# Patient Record
Sex: Male | Born: 1945 | Race: White | Hispanic: No | Marital: Married | Smoking: Former smoker
Health system: Southern US, Community
[De-identification: ages and names within clinical notes are randomized; demographics above are authoritative.]

## PROBLEM LIST (undated history)

## (undated) DIAGNOSIS — IMO0001 Reserved for inherently not codable concepts without codable children: Secondary | ICD-10-CM

## (undated) DIAGNOSIS — E7800 Pure hypercholesterolemia, unspecified: Secondary | ICD-10-CM

## (undated) DIAGNOSIS — I6522 Occlusion and stenosis of left carotid artery: Secondary | ICD-10-CM

## (undated) DIAGNOSIS — I480 Paroxysmal atrial fibrillation: Secondary | ICD-10-CM

## (undated) DIAGNOSIS — Z8679 Personal history of other diseases of the circulatory system: Secondary | ICD-10-CM

## (undated) DIAGNOSIS — R931 Abnormal findings on diagnostic imaging of heart and coronary circulation: Secondary | ICD-10-CM

## (undated) DIAGNOSIS — I10 Essential (primary) hypertension: Secondary | ICD-10-CM

## (undated) HISTORY — DX: Reserved for inherently not codable concepts without codable children: IMO0001

## (undated) HISTORY — DX: Paroxysmal atrial fibrillation: I48.0

## (undated) HISTORY — DX: Pure hypercholesterolemia, unspecified: E78.00

## (undated) HISTORY — DX: Occlusion and stenosis of left carotid artery: I65.22

## (undated) HISTORY — PX: INGUINAL HERNIA REPAIR: SHX194

## (undated) HISTORY — DX: Personal history of other diseases of the circulatory system: Z86.79

## (undated) HISTORY — DX: Essential (primary) hypertension: I10

## (undated) HISTORY — DX: Abnormal findings on diagnostic imaging of heart and coronary circulation: R93.1

## (undated) HISTORY — PX: CHOLECYSTECTOMY: SHX55

---

## 2000-02-21 ENCOUNTER — Encounter: Payer: Self-pay | Admitting: General Surgery

## 2000-02-26 ENCOUNTER — Ambulatory Visit (HOSPITAL_COMMUNITY): Admission: RE | Admit: 2000-02-26 | Discharge: 2000-02-26 | Payer: Self-pay | Admitting: General Surgery

## 2000-02-26 ENCOUNTER — Encounter (INDEPENDENT_AMBULATORY_CARE_PROVIDER_SITE_OTHER): Payer: Self-pay | Admitting: Specialist

## 2002-01-25 ENCOUNTER — Ambulatory Visit (HOSPITAL_COMMUNITY): Admission: RE | Admit: 2002-01-25 | Discharge: 2002-01-25 | Payer: Self-pay | Admitting: Gastroenterology

## 2008-03-16 DIAGNOSIS — IMO0001 Reserved for inherently not codable concepts without codable children: Secondary | ICD-10-CM

## 2008-03-16 HISTORY — DX: Reserved for inherently not codable concepts without codable children: IMO0001

## 2010-03-15 ENCOUNTER — Ambulatory Visit: Payer: Self-pay | Admitting: Cardiology

## 2010-04-11 ENCOUNTER — Ambulatory Visit: Payer: Self-pay | Admitting: Surgery

## 2010-10-29 NOTE — Procedures (Signed)
CAROTID DUPLEX EXAM   INDICATION:  Left-sided carotid stenosis, carotid artery disease.   HISTORY:  Diabetes:  No.  Cardiac:  Atrial fibrillation.  Hypertension:  Yes.  Smoking:  No.  Previous Surgery:  No.  CV History:  Asymptomatic.  Amaurosis Fugax , Paresthesias , Hemiparesis                                       RIGHT             LEFT  Brachial systolic pressure:         126               128  Brachial Doppler waveforms:         WNL               WNL  Vertebral direction of flow:        Antegrade         Antegrade  DUPLEX VELOCITIES (cm/sec)  CCA peak systolic                   91                96  ECA peak systolic                   110               114  ICA peak systolic                   81                134  ICA end diastolic                   26                42  PLAQUE MORPHOLOGY:                  Heterogenous      Heterogenous  PLAQUE AMOUNT:                      Mild-to-moderate  Mild-to-moderate  PLAQUE LOCATION:                    Proximal ICA, bifurcation, CCA      Proximal ICA   IMPRESSION:  1. Left-sided internal carotid artery stenosis of 40% to 59%.  2. No hemodynamically significant stenosis on the right.  3. Soft plaque is seen within the right common carotid artery and the      bulb.   ___________________________________________  V. Charlena Cross, MD   OD/MEDQ  D:  04/11/2010  T:  04/11/2010  Job:  [id number]

## 2010-11-01 NOTE — Op Note (Signed)
[facility]  Patient:    Henry Barnett, Henry Barnett                       MRN: [id number] Proc. Date: 02/26/00 Adm. Date:  [id number] Attending:  Glenna Fellows Tappan                           Operative Report  PREOPERATIVE DIAGNOSES: 1. Left inguinal hernia. 2. Undescended hypoplastic left testicle.  POSTOPERATIVE DIAGNOSES: 1. Left inguinal hernia. 2. Undescended hypoplastic left testicle.  PROCEDURES: 1. Repair of left inguinal hernia. 2. Left orchiectomy.  SURGEON:  Dr. Johna Sheriff.  ANESTHESIA:  General.  BRIEF HISTORY:  Mr. [name] is a 65 year old white male who presents with an enlarging and markedly symptomatic left inguinal hernia confirmed by exam. He also has a history of undescended left testicle and has a hyperplastic left testicle palpable in the left inguinal canal. Repair of his left inguinal hernia and left orchiectomy had been recommended and accepted. The nature of the procedure, its indications and risks of bleeding, infection, and recurrence were discussed and understood preoperatively. He is now brought to the operating room for this procedure.  DESCRIPTION OF PROCEDURE:  The patient was brought to the operating room, placed in supine position on the operating table and general endotracheal anesthesia was induced. Broad spectrum antibiotics were given preoperatively. The left groin was sterilely prepped and draped. An oblique incision was made in the left groin and dissection carried down through the subcutaneous tissue. The external oblique was identified and divided along the lines of its fibers through the external ring. There was a large indirect hernia sac that was bluntly dissected from surrounding tissue and dissected up out of the left scrotum. The small testicle was contained within the wall of the large hernia sac. The hernia sac was defined back to the muscular edges of the inguinal canal. The sac was opened and  was excised circumferentially around the large indirect defect. It was carefully stripped away from the spermatic vessels and vas deferens in their retroperitoneal position. At this point, the spermatic cord was divided in several bites between clamps and divided and the testicle and sac removed. The cord was tied with 3-0 Vicryl ties. The hernia sac was then closed at the markedly enlarged internal ring with running 3-0 Vicryl. Following this, a piece of Prolene mesh was trimmed to size to cover the direct and indirect spaces and was sutured in place with running 2-0 Prolene beginning at the pubic tubercle and working medial to lateral along the inguinal ligament. Medially the mesh was sutured to the edge of the rectosheath with interrupted 2-0 Prolene. This provided wide coverage under no tension. The wound was inspected for hemostasis and was complete. The soft tissue was infiltrated 0.25% Marcaine with epinephrine. The external oblique was closed over the mesh with running 3-0 Vicryl. Scarpas fascia was closed with running 3-0 Vicryl and the skin with running subcuticular 4-0 monocryl and Steri-Strips. Sponge, needle and instrument counts were correct. A dry sterile dressing was applied and the patient taken to the recovery room in good condition. DD:  02/26/00 TD:  02/27/00 Job: [id number]

## 2010-11-01 NOTE — Op Note (Signed)
   Henry Barnett, Henry Barnett                         ACCOUNT NO.:  [id number]   MEDICAL RECORD NO.:  [id number]                   PATIENT TYPE:  AMB   LOCATION:  ENDO                                 FACILITY:  [facility]   PHYSICIAN:  Barrie Folk, M.D.                  DATE OF BIRTH:  06/30/1945   DATE OF PROCEDURE:  DATE OF DISCHARGE:                                 OPERATIVE REPORT   PROCEDURE:  Colonoscopy.   INDICATIONS FOR PROCEDURE:  Nonspecific change in bowel habits in a 65-year-  old patient with no prior colon screening.   DESCRIPTION OF PROCEDURE:  The patient was placed in the left lateral  decubitus position then placed on the pulse monitor with continuous low flow  oxygen delivered by nasal cannula. He was sedated with 90 mg IV Demerol and  9 mg IV Versed. The Olympus video colonoscope was inserted into the rectum  and advanced to the cecum, confirmed by transillumination at McBurney's  point and visualization of the ileocecal valve and appendiceal orifice. The  prep was excellent. The cecum, ascending, transverse, descending and sigmoid  colon all appeared normal with no masses, polyps, diverticula or other  mucosal abnormalities. The rectum likewise appeared normal and retroflexed  view of the anus revealed no obvious internal hemorrhoids. The colonoscope  was then withdrawn and the patient returned to the recovery room in stable  condition. The patient tolerated the procedure well and there were no  immediate complications.   IMPRESSION:  Internal hemorrhoids, otherwise, normal colonoscopy.   PLAN:  Fiber supplement for nonspecific change in bowel habits.                                               Barrie Folk, M.D.    J[facility]/MEDQ  D:  01/25/2002  T:  01/26/2002  Job:  [id number]   cc:   Kristian Covey, M.D.

## 2010-11-12 ENCOUNTER — Other Ambulatory Visit: Payer: Self-pay | Admitting: Cardiology

## 2010-11-13 ENCOUNTER — Other Ambulatory Visit: Payer: Self-pay | Admitting: *Deleted

## 2010-11-13 MED ORDER — TELMISARTAN-HCTZ 40-12.5 MG PO TABS: 1.0000 | ORAL_TABLET | Freq: Every day | ORAL | Status: DC

## 2010-11-13 NOTE — Telephone Encounter (Signed)
Refill done.  

## 2011-01-31 ENCOUNTER — Encounter: Payer: Self-pay | Admitting: Cardiology

## 2011-02-06 ENCOUNTER — Other Ambulatory Visit: Payer: Self-pay | Admitting: Cardiology

## 2011-02-06 DIAGNOSIS — I10 Essential (primary) hypertension: Secondary | ICD-10-CM

## 2011-02-06 DIAGNOSIS — Z8679 Personal history of other diseases of the circulatory system: Secondary | ICD-10-CM

## 2011-02-06 DIAGNOSIS — E7800 Pure hypercholesterolemia, unspecified: Secondary | ICD-10-CM

## 2011-02-07 ENCOUNTER — Other Ambulatory Visit (INDEPENDENT_AMBULATORY_CARE_PROVIDER_SITE_OTHER): Payer: Medicare Other | Admitting: *Deleted

## 2011-02-07 DIAGNOSIS — Z8679 Personal history of other diseases of the circulatory system: Secondary | ICD-10-CM

## 2011-02-07 DIAGNOSIS — E7800 Pure hypercholesterolemia, unspecified: Secondary | ICD-10-CM

## 2011-02-07 DIAGNOSIS — I10 Essential (primary) hypertension: Secondary | ICD-10-CM

## 2011-02-07 LAB — HEPATIC FUNCTION PANEL
ALT: 27 U/L (ref 0–53)
Albumin: 4.3 g/dL (ref 3.5–5.2)
Total Bilirubin: 0.7 mg/dL (ref 0.3–1.2)

## 2011-02-07 LAB — BASIC METABOLIC PANEL
BUN: 18 mg/dL (ref 6–23)
Chloride: 102 mEq/L (ref 96–112)
Creatinine, Ser: 0.9 mg/dL (ref 0.4–1.5)
Glucose, Bld: 113 mg/dL — ABNORMAL HIGH (ref 70–99)

## 2011-02-07 LAB — LIPID PANEL
Cholesterol: 148 mg/dL (ref 0–200)
LDL Cholesterol: 92 mg/dL (ref 0–99)
Triglycerides: 72 mg/dL (ref 0.0–149.0)

## 2011-02-11 ENCOUNTER — Telehealth: Payer: Self-pay | Admitting: *Deleted

## 2011-02-11 NOTE — Telephone Encounter (Signed)
Message copied by Lorayne Bender on Tue Feb 11, 2011  4:56 PM ------      Message from: [place], PETER M      Created: Fri Feb 07, 2011  4:37 PM       Chemistries are ok. Lipids look fairly good.

## 2011-02-11 NOTE — Telephone Encounter (Signed)
Lm w/lab results. Has an app 8/31

## 2011-02-14 ENCOUNTER — Ambulatory Visit (INDEPENDENT_AMBULATORY_CARE_PROVIDER_SITE_OTHER): Payer: Medicare Other | Admitting: Cardiology

## 2011-02-14 ENCOUNTER — Encounter: Payer: Self-pay | Admitting: Cardiology

## 2011-02-14 DIAGNOSIS — E7800 Pure hypercholesterolemia, unspecified: Secondary | ICD-10-CM | POA: Insufficient documentation

## 2011-02-14 DIAGNOSIS — I4891 Unspecified atrial fibrillation: Secondary | ICD-10-CM

## 2011-02-14 DIAGNOSIS — I6522 Occlusion and stenosis of left carotid artery: Secondary | ICD-10-CM

## 2011-02-14 DIAGNOSIS — I480 Paroxysmal atrial fibrillation: Secondary | ICD-10-CM | POA: Insufficient documentation

## 2011-02-14 DIAGNOSIS — I10 Essential (primary) hypertension: Secondary | ICD-10-CM | POA: Insufficient documentation

## 2011-02-14 DIAGNOSIS — I6529 Occlusion and stenosis of unspecified carotid artery: Secondary | ICD-10-CM

## 2011-02-14 NOTE — Progress Notes (Signed)
   [name] Date of Birth: Sep 14, 1945   History of Present Illness: Mr. Mckowen is seen today for followup. In general he has done very well since his visit last year. 6-8 weeks ago he got up in the middle of night to go to the bathroom and noticed that he was staggering. His wife did a neurologic assessment didn't fill it he had any TIA symptoms. He went back to bed and his symptoms resolved. He's had no further difficulty. He denies any chest pain, palpitations, or shortness of breath.  Current Outpatient Prescriptions on File Prior to Visit  Medication Sig Dispense Refill  . Acetaminophen (TYLENOL PO) Take by mouth as needed.        Marland Kitchen aspirin 81 MG tablet Take 81 mg by mouth daily.        . fish oil-omega-3 fatty acids 1000 MG capsule Take 2 g by mouth 2 (two) times daily.        . Sildenafil Citrate (VIAGRA PO) Take by mouth as needed.        . simvastatin (ZOCOR) 20 MG tablet Take 20 mg by mouth at bedtime.        Marland Kitchen telmisartan-hydrochlorothiazide (MICARDIS HCT) 40-12.5 MG per tablet Take 1 tablet by mouth daily.  30 tablet  5    Allergies  Allergen Reactions  . Ace Inhibitors Cough    Past Medical History  Diagnosis Date  . Hypertension   . Hypercholesteremia   . Carotid stenosis, left     moderate Left carotid stenosis  . Paroxysmal a-fib     Past Surgical History  Procedure Date  . Inguinal hernia repair     Status post left inguinal hernia repair  . Cholecystectomy     History  Smoking status  . Former Smoker  . Quit date: 01/31/1975  Smokeless tobacco  . Not on file    History  Alcohol Use No    Family History  Problem Relation Age of Onset  . Heart disease Mother   . Stroke Mother     Review of Systems: The review of systems is positive for low testosterone levels.  He was started on replacement but finds that it's too expensive. All other systems were reviewed and are negative.  Physical Exam: BP 130/78  Pulse 66  Ht 6\' 1"  (1.854 m)  Wt  241 lb 9.6 oz (109.589 kg)  BMI 31.88 kg/m2 He is a pleasant white male in no acute distress. He is normocephalic, atraumatic. Pupils equal round and reactive to light and accommodation. Extraocular movements are full. Oropharynx is clear. Neck is supple without JVD, adenopathy, thyromegaly, or bruits. Lungs are clear. Cardiac exam reveals a regular rate and rhythm without gallop, murmur, or click. He has good pedal pulses. There is no edema. Skin is warm and dry. Neurologic exam is nonfocal. LABORATORY DATA: Complete chemistry panel was normal. Total cholesterol 148, triglycerides 72, HDL 41, LDL 92.  Assessment / Plan:

## 2011-02-14 NOTE — Assessment & Plan Note (Signed)
No evidence of recurrence by history or exam. We will continue surveillance.

## 2011-02-14 NOTE — Patient Instructions (Signed)
Continue your current medications.  I will see you again in 1 year with fasting lab.   

## 2011-02-14 NOTE — Assessment & Plan Note (Signed)
40-60% stenosis in the left carotid by Doppler in October 2011. He is asymptomatic. Continue risk factor modification.

## 2011-02-14 NOTE — Assessment & Plan Note (Signed)
Recent lipid panel was acceptable.

## 2011-03-11 ENCOUNTER — Other Ambulatory Visit: Payer: Self-pay | Admitting: Cardiology

## 2011-03-12 ENCOUNTER — Other Ambulatory Visit: Payer: Self-pay | Admitting: *Deleted

## 2011-03-12 MED ORDER — SIMVASTATIN 20 MG PO TABS: 20.0000 mg | ORAL_TABLET | Freq: Every day | ORAL | Status: DC

## 2011-03-12 NOTE — Telephone Encounter (Signed)
Fax Received. Refill Completed. Jezabel Lecker Chowoe (M.A)  

## 2011-06-08 ENCOUNTER — Other Ambulatory Visit: Payer: Self-pay | Admitting: Cardiology

## 2011-07-11 ENCOUNTER — Ambulatory Visit (INDEPENDENT_AMBULATORY_CARE_PROVIDER_SITE_OTHER): Payer: Medicare Other | Admitting: Nurse Practitioner

## 2011-07-11 ENCOUNTER — Encounter: Payer: Self-pay | Admitting: Nurse Practitioner

## 2011-07-11 VITALS — BP 142/100 | HR 97 | Ht 73.0 in | Wt 253.0 lb

## 2011-07-11 DIAGNOSIS — I4891 Unspecified atrial fibrillation: Secondary | ICD-10-CM

## 2011-07-11 DIAGNOSIS — I10 Essential (primary) hypertension: Secondary | ICD-10-CM

## 2011-07-11 DIAGNOSIS — I480 Paroxysmal atrial fibrillation: Secondary | ICD-10-CM

## 2011-07-11 LAB — HEPATIC FUNCTION PANEL
ALT: 21 U/L (ref 0–53)
AST: 19 U/L (ref 0–37)
Albumin: 4.4 g/dL (ref 3.5–5.2)
Alkaline Phosphatase: 62 U/L (ref 39–117)
Bilirubin, Direct: 0.1 mg/dL (ref 0.0–0.3)
Total Bilirubin: 0.9 mg/dL (ref 0.3–1.2)
Total Protein: 7.8 g/dL (ref 6.0–8.3)

## 2011-07-11 LAB — BASIC METABOLIC PANEL
BUN: 24 mg/dL — ABNORMAL HIGH (ref 6–23)
CO2: 25 mEq/L (ref 19–32)
Calcium: 9.1 mg/dL (ref 8.4–10.5)
Chloride: 104 mEq/L (ref 96–112)
Creatinine, Ser: 0.9 mg/dL (ref 0.4–1.5)
GFR: 84.84 mL/min (ref >60.00)
Glucose, Bld: 112 mg/dL — ABNORMAL HIGH (ref 70–99)
Potassium: 4.2 mEq/L (ref 3.5–5.1)
Sodium: 136 mEq/L (ref 135–145)

## 2011-07-11 LAB — CBC WITH DIFFERENTIAL/PLATELET
Basophils Absolute: 0.1 10*3/uL (ref 0.0–0.1)
Basophils Relative: 1.2 % (ref 0.0–3.0)
Eosinophils Absolute: 0.2 10*3/uL (ref 0.0–0.7)
Eosinophils Relative: 3 % (ref 0.0–5.0)
HCT: 46.3 % (ref 39.0–52.0)
Hemoglobin: 15.8 g/dL (ref 13.0–17.0)
Lymphocytes Relative: 26.2 % (ref 12.0–46.0)
Lymphs Abs: 1.8 10*3/uL (ref 0.7–4.0)
MCHC: 34.2 g/dL (ref 30.0–36.0)
MCV: 88.3 fl (ref 78.0–100.0)
Monocytes Absolute: 0.8 10*3/uL (ref 0.1–1.0)
Monocytes Relative: 11.6 % (ref 3.0–12.0)
Neutro Abs: 4.1 10*3/uL (ref 1.4–7.7)
Neutrophils Relative %: 58 % (ref 43.0–77.0)
Platelets: 214 10*3/uL (ref 150.0–400.0)
RBC: 5.24 Mil/uL (ref 4.22–5.81)
RDW: 13 % (ref 11.5–14.6)
WBC: 7 10*3/uL (ref 4.5–10.5)

## 2011-07-11 LAB — TSH: TSH: 3.12 u[IU]/mL (ref 0.35–5.50)

## 2011-07-11 MED ORDER — RIVAROXABAN 20 MG PO TABS: 20.0000 mg | ORAL_TABLET | Freq: Every day | ORAL | Status: DC

## 2011-07-11 NOTE — Assessment & Plan Note (Signed)
He says he has better blood pressure control at home. I have left him on his current medicines.

## 2011-07-11 NOTE — Progress Notes (Signed)
Henry Barnett Date of Birth: 08/03/1945 Medical Record #[id number]  History of Present Illness: Henry Barnett is seen back today for a follow up visit. He is seen for Dr. [place]. He called earlier in the week to report that he thought he was out of rhythm. He notes that his normal heart rate is in the 55 to 65 range. Over the last week or so, his heart rate has been 75 to 85. He notes that he has some occasional dizziness and some mild chest tightness during this timeframe. He comes in for follow up and his back in atrial fibrillation.   He has no exertional symptoms. No reports of syncope. Says his blood pressure is better at home. Reports that this is his first spell in 5 to 6 years.   Current Outpatient Prescriptions on File Prior to Visit  Medication Sig Dispense Refill  . Acetaminophen (TYLENOL PO) Take by mouth as needed.        Marland Kitchen aspirin 81 MG tablet Take 81 mg by mouth daily.        . fish oil-omega-3 fatty acids 1000 MG capsule Take 2 g by mouth 2 (two) times daily.        Marland Kitchen MICARDIS HCT 40-12.5 MG per tablet TAKE ONE TABLET BY MOUTH EVERY DAY  30 each  5  . Sildenafil Citrate (VIAGRA PO) Take by mouth as needed.        . simvastatin (ZOCOR) 20 MG tablet Take 1 tablet (20 mg total) by mouth at bedtime.  30 tablet  5  . Rivaroxaban (XARELTO) 20 MG TABS Take 20 mg by mouth daily.  30 tablet  6    Allergies  Allergen Reactions  . Ace Inhibitors Cough    Past Medical History  Diagnosis Date  . Hypertension   . Hypercholesteremia   . Carotid stenosis, left     moderate Left carotid stenosis  . Paroxysmal a-fib     previously on Flecainide  . Chronic anticoagulation     Xarelto started 07/11/11  . Normal nuclear stress test Oct 2009    No ischemia with EF of 63%  . H/O: rheumatic fever   . Abnormal echocardiogram Sept 2009    Mild LVH, Mild mitral and tricuspid insufficiency and mild LAE. EF was 55 to 60%.     Past Surgical History  Procedure Date  . Inguinal hernia repair       Status post left inguinal hernia repair  . Cholecystectomy     History  Smoking status  . Former Smoker  . Quit date: 01/31/1975  Smokeless tobacco  . Not on file    History  Alcohol Use No    Family History  Problem Relation Age of Onset  . Heart disease Mother   . Stroke Mother     Review of Systems: The review of systems is positive for fatigue which has been chronic.  All other systems were reviewed and are negative.  Physical Exam: BP 142/100  Pulse 97  Ht 6\' 1"  (1.854 m)  Wt 253 lb (114.76 kg)  BMI 33.38 kg/m2 Patient is very pleasant and in no acute distress. He is obese. Skin is warm and dry. Color is normal.  HEENT is unremarkable. Normocephalic/atraumatic. PERRL. Sclera are nonicteric. Neck is supple. No masses. No JVD. Lungs are clear. Cardiac exam shows an irregular rhythm. His rate is controlled and slower on physical exam than on EKG. Abdomen is obese but soft. Extremities are without edema. Gait and ROM  are intact. No gross neurologic deficits noted.   LABORATORY DATA: EKG shows atrial fibrillation. Rate is 97 bpm.    Assessment / Plan:

## 2011-07-11 NOTE — Patient Instructions (Addendum)
Stop your aspirin on Monday.  We will start you on Xarelto 20 mg daily. Take with your largest meal of the day.  Use Tylenol for any general discomforts and not Advil, Alleve, Ibuprofen. These can increase your risk for bleeding.  You are back in atrial fib. We are going to put you on blood thinner, check your labs, and update your ultrasound of your heart.  We will plan on cardioversion in 4 weeks.  Call the [facility] office at [phone] if you have any questions, problems or concerns.

## 2011-07-11 NOTE — Assessment & Plan Note (Addendum)
He is back in atrial fibrillation. Rate is fairly well controlled. He is not terribly symptomatic. Duration seems to be over the past week. He is interested in cardioversion. He has been on flecainide in the past. We will update his echo, check his labs today. We discussed the various forms of anticoagulation and he is wanting to go on Xarelto. Samples are provided along with full instruction. We will start him on 20 mg with his evening meal. He will stop his aspirin on Monday.  I will see him back in a month. If he remains in atrial fib, we will proceed on with cardioversion. Patient is agreeable to this plan and will call if any problems develop in the interim.

## 2011-07-15 ENCOUNTER — Ambulatory Visit (HOSPITAL_COMMUNITY): Payer: Medicare Other | Attending: Cardiology | Admitting: Radiology

## 2011-07-15 DIAGNOSIS — I10 Essential (primary) hypertension: Secondary | ICD-10-CM | POA: Insufficient documentation

## 2011-07-15 DIAGNOSIS — I679 Cerebrovascular disease, unspecified: Secondary | ICD-10-CM | POA: Insufficient documentation

## 2011-07-15 DIAGNOSIS — E785 Hyperlipidemia, unspecified: Secondary | ICD-10-CM | POA: Insufficient documentation

## 2011-07-15 DIAGNOSIS — I4891 Unspecified atrial fibrillation: Secondary | ICD-10-CM

## 2011-07-15 DIAGNOSIS — E669 Obesity, unspecified: Secondary | ICD-10-CM | POA: Insufficient documentation

## 2011-07-16 ENCOUNTER — Telehealth: Payer: Self-pay | Admitting: Cardiology

## 2011-07-16 NOTE — Telephone Encounter (Signed)
Pt Signed ROI, Echo Mailed to Pt today 07/16/11/KM

## 2011-07-17 ENCOUNTER — Other Ambulatory Visit (HOSPITAL_COMMUNITY): Payer: Medicare Other | Admitting: Radiology

## 2011-07-18 DIAGNOSIS — R931 Abnormal findings on diagnostic imaging of heart and coronary circulation: Secondary | ICD-10-CM

## 2011-07-18 HISTORY — DX: Abnormal findings on diagnostic imaging of heart and coronary circulation: R93.1

## 2011-08-11 ENCOUNTER — Ambulatory Visit (INDEPENDENT_AMBULATORY_CARE_PROVIDER_SITE_OTHER): Payer: Medicare Other | Admitting: Nurse Practitioner

## 2011-08-11 ENCOUNTER — Encounter: Payer: Self-pay | Admitting: Nurse Practitioner

## 2011-08-11 VITALS — BP 118/92 | HR 86 | Ht 73.0 in | Wt 253.0 lb

## 2011-08-11 DIAGNOSIS — I10 Essential (primary) hypertension: Secondary | ICD-10-CM

## 2011-08-11 DIAGNOSIS — I4891 Unspecified atrial fibrillation: Secondary | ICD-10-CM

## 2011-08-11 DIAGNOSIS — I480 Paroxysmal atrial fibrillation: Secondary | ICD-10-CM

## 2011-08-11 MED ORDER — FLECAINIDE ACETATE 50 MG PO TABS: 50.0000 mg | ORAL_TABLET | Freq: Two times a day (BID) | ORAL | Status: DC

## 2011-08-11 NOTE — Assessment & Plan Note (Signed)
He remains in atrial fib. EF is now down a little to 45 to 50%. Does have biatrial enlargement. I think he will need antiarrhythmic therapy to help get him back in sinus. He had no problems with Flecainide in the past. I have restarted it at 50 mg BID. I will see him back in a week and try to increase to 100 mg. If he remains in atrial fib over the next few weeks, will then proceed on with cardioversion. Option of staying in atrial fib with long term anticoagulation and rate control was also presented. Patient is agreeable to this plan and will call if any problems develop in the interim.

## 2011-08-11 NOTE — Assessment & Plan Note (Signed)
Blood pressure is ok with his current regimen.

## 2011-08-11 NOTE — Patient Instructions (Signed)
We are going to add Flecainide 50 mg two times a day. This will help with your rhythm  Stay on your other medicines.  I will see you in a week with a repeat EKG.  Call the [facility] office at [phone] if you have any questions, problems or concerns.

## 2011-08-11 NOTE — Progress Notes (Signed)
Henry Barnett Date of Birth: 03-18-1946 Medical Record #[id number]  History of Present Illness: Henry Barnett is seen back today for a one month check. He is seen for Dr. [place]. He has had recurrent atrial fib. Now on Xarelto. Does note some fatigue but no real palpitations. Not dizzy or lightheaded. His echo was updated and showed his EF to be a little reduced in the 45 to 50% range. Did have biatrial enlargement. Was previously on Flecainide but this was stopped back in 2010.   He comes in today. He is here with his wife. He remains a little fatigued. His wife thinks he has been doing ok. Not lightheaded or dizzy. Has had no problems with Xarelto. No chest pain.   Current Outpatient Prescriptions on File Prior to Visit  Medication Sig Dispense Refill  . Acetaminophen (TYLENOL PO) Take by mouth as needed.        . fish oil-omega-3 fatty acids 1000 MG capsule Take 2 g by mouth 2 (two) times daily.        Marland Kitchen MICARDIS HCT 40-12.5 MG per tablet TAKE ONE TABLET BY MOUTH EVERY DAY  30 each  5  . Rivaroxaban (XARELTO) 20 MG TABS Take 20 mg by mouth daily.  30 tablet  6  . Sildenafil Citrate (VIAGRA PO) Take by mouth as needed.        . simvastatin (ZOCOR) 20 MG tablet Take 1 tablet (20 mg total) by mouth at bedtime.  30 tablet  5  . aspirin 81 MG tablet Take 81 mg by mouth daily.          Allergies  Allergen Reactions  . Ace Inhibitors Cough    Past Medical History  Diagnosis Date  . Hypertension   . Hypercholesteremia   . Carotid stenosis, left     moderate Left carotid stenosis  . Paroxysmal a-fib     previously on Flecainide  . Chronic anticoagulation     Xarelto started 07/11/11  . Normal nuclear stress test Oct 2009    No ischemia with EF of 63%  . H/O: rheumatic fever   . Abnormal echocardiogram Sept 2009    Mild LVH, Mild mitral and tricuspid insufficiency and mild LAE. EF was 55 to 60%.; Echo Feb 2013 with EF 45 to 50% and biatrial enlargement    Past Surgical History    Procedure Date  . Inguinal hernia repair     Status post left inguinal hernia repair  . Cholecystectomy     History  Smoking status  . Former Smoker  . Quit date: 01/31/1975  Smokeless tobacco  . Not on file    History  Alcohol Use No    Family History  Problem Relation Age of Onset  . Heart disease Mother   . Stroke Mother     Review of Systems: The review of systems is positive for fatigue.  All other systems were reviewed and are negative.  Physical Exam: BP 118/92  Pulse 86  Ht 6\' 1"  (1.854 m)  Wt 253 lb (114.76 kg)  BMI 33.38 kg/m2 Patient is very pleasant and in no acute distress. Skin is warm and dry. Color is normal.  HEENT is unremarkable. Normocephalic/atraumatic. PERRL. Sclera are nonicteric. Neck is supple. No masses. No JVD. Lungs are clear. Cardiac exam shows an irregular rhythm. His rate is controlled. Abdomen is soft. Extremities are without edema. Gait and ROM are intact. No gross neurologic deficits noted.  LABORATORY DATA: EKG shows atrial fib with a controlled  ventricular response.   Assessment / Plan:

## 2011-08-19 ENCOUNTER — Encounter: Payer: Self-pay | Admitting: Nurse Practitioner

## 2011-08-19 ENCOUNTER — Ambulatory Visit (INDEPENDENT_AMBULATORY_CARE_PROVIDER_SITE_OTHER): Payer: Medicare Other | Admitting: Nurse Practitioner

## 2011-08-19 VITALS — BP 108/70 | HR 87 | Ht 73.0 in | Wt 248.0 lb

## 2011-08-19 DIAGNOSIS — I480 Paroxysmal atrial fibrillation: Secondary | ICD-10-CM

## 2011-08-19 DIAGNOSIS — I4891 Unspecified atrial fibrillation: Secondary | ICD-10-CM

## 2011-08-19 MED ORDER — FLECAINIDE ACETATE 50 MG PO TABS: 100.0000 mg | ORAL_TABLET | Freq: Two times a day (BID) | ORAL | Status: DC

## 2011-08-19 MED ORDER — FLECAINIDE ACETATE 100 MG PO TABS: 100.0000 mg | ORAL_TABLET | Freq: Two times a day (BID) | ORAL | Status: DC

## 2011-08-19 NOTE — Progress Notes (Signed)
Henry Barnett Date of Birth: [birth year] Medical Record #[id number]  History of Present Illness: Mr. Latulippe is seen back today for a one week check. He is seen for Dr. [place]. He has recurrent atrial fib. Not sure as to the duration. On Xarelto. Flecainide was added. He has biatrial enlargement on his echo with slight reduction of his EF to the 45 to 50% range.   He comes in today. He is here alone. He is feeling pretty good. Still a little fatigued and has an awareness of his heart being out of rhythm. Not dizzy or lightheaded. Tolerating his medicines without issue.   Current Outpatient Prescriptions on File Prior to Visit  Medication Sig Dispense Refill  . Acetaminophen (TYLENOL PO) Take by mouth as needed.        . fish oil-omega-3 fatty acids 1000 MG capsule Take 2 g by mouth 2 (two) times daily.        . flecainide (TAMBOCOR) 50 MG tablet Take 1 tablet (50 mg total) by mouth 2 (two) times daily.  60 tablet  6  . fluticasone (FLONASE) 50 MCG/ACT nasal spray Place 1 spray into the nose daily.      Marland Kitchen MICARDIS HCT 40-12.5 MG per tablet TAKE ONE TABLET BY MOUTH EVERY DAY  30 each  5  . Rivaroxaban (XARELTO) 20 MG TABS Take 20 mg by mouth daily.  30 tablet  6  . Sildenafil Citrate (VIAGRA PO) Take by mouth as needed.        . simvastatin (ZOCOR) 20 MG tablet Take 1 tablet (20 mg total) by mouth at bedtime.  30 tablet  5    Allergies  Allergen Reactions  . Ace Inhibitors Cough    Past Medical History  Diagnosis Date  . Hypertension   . Hypercholesteremia   . Carotid stenosis, left     moderate Left carotid stenosis  . Paroxysmal a-fib     previously on Flecainide  . Chronic anticoagulation     Xarelto started 07/11/11  . Normal nuclear stress test Oct 2009    No ischemia with EF of 63%  . H/O: rheumatic fever   . Abnormal echocardiogram Sept 2009    Mild LVH, Mild mitral and tricuspid insufficiency and mild LAE. EF was 55 to 60%.; Echo Feb 2013 with EF 45 to 50% and biatrial  enlargement  . Abnormal echocardiogram Feb 2013    biatrial enlargement. EF 45 to 50%.     Past Surgical History  Procedure Date  . Inguinal hernia repair     Status post left inguinal hernia repair  . Cholecystectomy     History  Smoking status  . Former Smoker  . Quit date: 01/31/1975  Smokeless tobacco  . Not on file    History  Alcohol Use No    Family History  Problem Relation Age of Onset  . Heart disease Mother   . Stroke Mother     Review of Systems: The review of systems is per the HPI.  All other systems were reviewed and are negative.  Physical Exam: BP 108/70  Pulse 87  Ht 6\' 1"  (1.854 m)  Wt 248 lb (112.492 kg)  BMI 32.72 kg/m2 Patient is very pleasant and in no acute distress. He is obese.  Skin is warm and dry. Color is normal.  HEENT is unremarkable. Normocephalic/atraumatic. PERRL. Sclera are nonicteric. Neck is supple. No masses. No JVD. Lungs are clear. Cardiac exam shows an irregular rhythm. Rate is controlled. Abdomen is  soft. Extremities are without edema. Gait and ROM are intact. No gross neurologic deficits noted.   LABORATORY DATA: EKG today shows atrial fibrillation with a controlled ventricular response.   Assessment / Plan:

## 2011-08-19 NOTE — Assessment & Plan Note (Signed)
He remains in atrial fib. Rate is controlled. He remains on his Xarelto without problems. Will increase the Flecainide to 100 mg BID. Will see him back with Dr. [place] in about 10 days. If he remains in atrial fib, then will proceed on with scheduling cardioversion. Patient is agreeable to this plan and will call if any problems develop in the interim.

## 2011-08-19 NOTE — Patient Instructions (Signed)
Increase the Flecainide to 100 mg two times a day.  We will see you back in about 10 days. If you remain out of rhythm at that visit, then we will proceed with setting up a cardioversion.  Stay on your other medicines.  Call the [facility] office at [phone] if you have any questions, problems or concerns.

## 2011-08-29 ENCOUNTER — Other Ambulatory Visit: Payer: Self-pay | Admitting: Nurse Practitioner

## 2011-08-29 ENCOUNTER — Encounter: Payer: Self-pay | Admitting: Nurse Practitioner

## 2011-08-29 ENCOUNTER — Ambulatory Visit
Admission: RE | Admit: 2011-08-29 | Discharge: 2011-08-29 | Disposition: A | Discharge status: Home / Self Care | Payer: Medicare Other | Source: Ambulatory Visit | Attending: Nurse Practitioner | Admitting: Nurse Practitioner

## 2011-08-29 ENCOUNTER — Ambulatory Visit (INDEPENDENT_AMBULATORY_CARE_PROVIDER_SITE_OTHER): Payer: Medicare Other | Admitting: Nurse Practitioner

## 2011-08-29 VITALS — BP 112/82 | HR 95 | Ht 73.0 in | Wt 252.0 lb

## 2011-08-29 DIAGNOSIS — I4891 Unspecified atrial fibrillation: Secondary | ICD-10-CM

## 2011-08-29 DIAGNOSIS — I480 Paroxysmal atrial fibrillation: Secondary | ICD-10-CM

## 2011-08-29 LAB — CBC WITH DIFFERENTIAL/PLATELET
Basophils Absolute: 0 K/uL (ref 0.0–0.1)
Basophils Relative: 0.7 % (ref 0.0–3.0)
Eosinophils Absolute: 0.1 K/uL (ref 0.0–0.7)
Eosinophils Relative: 2.1 % (ref 0.0–5.0)
HCT: 42.5 % (ref 39.0–52.0)
Hemoglobin: 14.3 g/dL (ref 13.0–17.0)
Lymphocytes Relative: 25.1 % (ref 12.0–46.0)
Lymphs Abs: 1.4 K/uL (ref 0.7–4.0)
MCHC: 33.7 g/dL (ref 30.0–36.0)
MCV: 88.3 fl (ref 78.0–100.0)
Monocytes Absolute: 0.6 K/uL (ref 0.1–1.0)
Monocytes Relative: 11.8 % (ref 3.0–12.0)
Neutro Abs: 3.3 K/uL (ref 1.4–7.7)
Neutrophils Relative %: 60.3 % (ref 43.0–77.0)
Platelets: 187 K/uL (ref 150.0–400.0)
RBC: 4.82 Mil/uL (ref 4.22–5.81)
RDW: 13 % (ref 11.5–14.6)
WBC: 5.4 K/uL (ref 4.5–10.5)

## 2011-08-29 LAB — BASIC METABOLIC PANEL
BUN: 18 mg/dL (ref 6–23)
CO2: 27 mEq/L (ref 19–32)
Calcium: 9.2 mg/dL (ref 8.4–10.5)
Chloride: 101 mEq/L (ref 96–112)
Creatinine, Ser: 0.9 mg/dL (ref 0.4–1.5)
GFR: 86.94 mL/min (ref >60.00)
Glucose, Bld: 104 mg/dL — ABNORMAL HIGH (ref 70–99)
Potassium: 3.9 mEq/L (ref 3.5–5.1)
Sodium: 137 mEq/L (ref 135–145)

## 2011-08-29 LAB — PROTIME-INR
INR: 1.3 ratio — ABNORMAL HIGH (ref 0.8–1.0)
Prothrombin Time: 14 s — ABNORMAL HIGH (ref 10.2–12.4)

## 2011-08-29 LAB — APTT: aPTT: 35.2 s — ABNORMAL HIGH (ref 21.7–28.8)

## 2011-08-29 NOTE — Patient Instructions (Signed)
We are going to check some labs today.  Go to [facility] for your chest xray today.  You are scheduled for a cardioversion on next Wednesday, March 20th  at 1pm  with Dr. Patty Sermons or associates. Please go to [facility] 2nd [facility] Short Stay at 11am on Wednesday, March 20th.  Do not have any food or drink after midnight on Tuesday.  You may take your medicines with a sip of water on the day of your procedure.  You will need someone to drive you home following your procedure.    Electrical Cardioversion Cardioversion is the delivery of a jolt of electricity to change the rhythm of the heart. Sticky patches or metal paddles are placed on the chest to deliver the electricity from a special device. This is done to restore a normal rhythm. A rhythm that is too fast or not regular keeps the heart from pumping well. Compared to medicines used to change an abnormal rhythm, cardioversion is faster and works better. It is also unpleasant and may dislodge blood clots from the heart. WHEN WOULD THIS BE DONE?  In an emergency:   There is low or no blood pressure as a result of the heart rhythm.   Normal rhythm must be restored as fast as possible to protect the brain and heart from further damage.   It may save a life.   For less serious heart rhythms, such as atrial fibrillation or flutter, in which:   The heart is beating too fast or is not regular.   The heart is still able to pump enough blood, but not as well as it should.   Medicine to change the rhythm has not worked.   It is safe to wait in order to allow time for preparation.  LET YOUR CAREGIVER KNOW ABOUT:   Every medicine you are taking. It is very important to do this! Know when to take or stop taking any of them.   Any time in the past that you have felt your heart was not beating normally.  RISKS AND COMPLICATIONS   Clots may form in the chambers of the heart if it is beating too fast. These  clots may be dislodged during the procedure and travel to other parts of the body.   There is risk of a stroke during and after the procedure if a clot moves. Blood thinners lower this risk.   You may have a special test of your heart (TEE) to make sure there are no clots in your heart.  BEFORE THE PROCEDURE   You may have some tests to see how well your heart is working.   You may start taking blood thinners so your blood does not clot as easily.   Other drugs may be given to help your heart work better.  PROCEDURE (SCHEDULED)  The procedure is typically done in a hospital by a heart doctor (cardiologist).   You will be told when and where to go.   You may be given some medicine through an intravenous (IV) access to reduce discomfort and make you sleepy before the procedure.   Your whole body may move when the shock is delivered. Your chest may feel sore.   You may be able to go home after a few hours. Your heart rhythm will be watched to make sure it does not change.  HOME CARE INSTRUCTIONS   Only take medicine as directed by your caregiver. Be sure you understand how and when  to take your medicine.   Learn how to feel your pulse and check it often.   Limit your activity for 48 hours.   Avoid caffeine and other stimulants as directed.  SEEK MEDICAL CARE IF:   You feel like your heart is beating too fast or your pulse is not regular.   You have any questions about your medicines.   You have bleeding that will not stop.  SEEK IMMEDIATE MEDICAL CARE IF:   You are dizzy or feel faint.   It is hard to breathe or you feel short of breath.   There is a change in discomfort in your chest.   Your speech is slurred or you have trouble moving your arm or leg on one side.   You get a muscle cramp.   Your fingers or toes turn cold or blue.  MAKE SURE YOU:   Understand these instructions.   Will watch your condition.   Will get help right away if you are not doing well  or get worse.  Document Released: 05/23/2002 Document Revised: 05/22/2011 Document Reviewed: 09/22/2007 [facility] Patient Information 2012 [place], [place].

## 2011-08-29 NOTE — Assessment & Plan Note (Addendum)
He remains in atrial fib. He would like to proceed on with attempts at cardioversion. The procedure has been scheduled for next Wednesday with Dr. Patty Sermons. The procedure was reviewed with him and he is willing to proceed. Will plan on seeing him back with an EKG in 3 weeks. Samples of his Xarelto are given today. He is also advised to keep his stools soft. Will be checking complete labs today as well. Patient is agreeable to this plan and will call if any problems develop in the interim.   Have discussed with Dr. Swaziland. Long term anticoagulation is felt to be best for him.

## 2011-08-29 NOTE — Progress Notes (Signed)
Henry Barnett Date of Birth: 1946-01-09 Medical Record #[id number]  History of Present Illness: Henry Barnett is seen back today for a 10 day check. He is seen for Dr. Swaziland. He has had recurrent atrial fib. Now on Xarelto and back on Flecainide. Still in atrial fib. Has some fatigue and notes some palpitations but not too symptomatic. Did have biatrial enlargment on his echo and he had been on Flecainide in the past. EF is 45 to 50%. No known history of CAD.  He comes in today. He is here with his wife. Feels about the same. Ready to proceed on with cardioversion. Notes the Xarelto is expensive. ? Of long term compliance and may need coumadin if needs long term anticoagulation. No chest pain. Some mild bleeding from his hemorrhoids is reported. Just notes some bright red blood on the tissue.   Current Outpatient Prescriptions on File Prior to Visit  Medication Sig Dispense Refill  . Acetaminophen (TYLENOL PO) Take by mouth as needed.        . fish oil-omega-3 fatty acids 1000 MG capsule Take 2 g by mouth 2 (two) times daily.        . flecainide (TAMBOCOR) 100 MG tablet Take 1 tablet (100 mg total) by mouth 2 (two) times daily.  60 tablet  6  . fluticasone (FLONASE) 50 MCG/ACT nasal spray Place 1 spray into the nose daily.      Marland Kitchen MICARDIS HCT 40-12.5 MG per tablet TAKE ONE TABLET BY MOUTH EVERY DAY  30 each  5  . Rivaroxaban (XARELTO) 20 MG TABS Take 20 mg by mouth daily.  30 tablet  6  . Sildenafil Citrate (VIAGRA PO) Take by mouth as needed.        . simvastatin (ZOCOR) 20 MG tablet Take 1 tablet (20 mg total) by mouth at bedtime.  30 tablet  5    Allergies  Allergen Reactions  . Ace Inhibitors Cough    Past Medical History  Diagnosis Date  . Hypertension   . Hypercholesteremia   . Carotid stenosis, left     moderate Left carotid stenosis  . Paroxysmal a-fib     previously on Flecainide  . Chronic anticoagulation     Xarelto started 07/11/11  . Normal nuclear stress test Oct 2009    No ischemia with EF of 63%  . H/O: rheumatic fever   . Abnormal echocardiogram Sept 2009    Mild LVH, Mild mitral and tricuspid insufficiency and mild LAE. EF was 55 to 60%.; Echo Feb 2013 with EF 45 to 50% and biatrial enlargement  . Abnormal echocardiogram Feb 2013    biatrial enlargement. EF 45 to 50%.     Past Surgical History  Procedure Date  . Inguinal hernia repair     Status post left inguinal hernia repair  . Cholecystectomy     History  Smoking status  . Former Smoker  . Quit date: 01/31/1975  Smokeless tobacco  . Not on file    History  Alcohol Use No    Family History  Problem Relation Age of Onset  . Heart disease Mother   . Stroke Mother     Review of Systems: The review of systems is per the HPI.  All other systems were reviewed and are negative.  Physical Exam: BP 112/82  Pulse 95  Ht 6\' 1"  (1.854 m)  Wt 252 lb (114.306 kg)  BMI 33.25 kg/m2 Patient is very pleasant and in no acute distress. He is obese. Skin  is warm and dry. Color is normal.  HEENT is unremarkable. Normocephalic/atraumatic. PERRL. Sclera are nonicteric. Neck is supple. No masses. No JVD. Lungs are clear. Cardiac exam shows an irregular rhythm. Rate is controlled. Abdomen is soft. Extremities are without edema. Gait and ROM are intact. No gross neurologic deficits noted.   LABORATORY DATA: EKG today shows atrial fib with a ventricular rate of 95.   Assessment / Plan:

## 2011-09-01 ENCOUNTER — Ambulatory Visit (INDEPENDENT_AMBULATORY_CARE_PROVIDER_SITE_OTHER): Payer: Medicare Other | Admitting: *Deleted

## 2011-09-01 ENCOUNTER — Encounter (HOSPITAL_COMMUNITY): Payer: Self-pay | Admitting: Pharmacy Technician

## 2011-09-01 ENCOUNTER — Telehealth: Payer: Self-pay | Admitting: Cardiology

## 2011-09-01 VITALS — Resp 18 | Ht 73.0 in | Wt 252.0 lb

## 2011-09-01 DIAGNOSIS — I4891 Unspecified atrial fibrillation: Secondary | ICD-10-CM

## 2011-09-01 NOTE — Telephone Encounter (Signed)
Pt feels like his heart is back in rhythm.  He wants to know if the cardioversion should be cancelled?

## 2011-09-01 NOTE — Telephone Encounter (Signed)
Pt calling re heart out of rhythm , has cardioversion this week, has question, pls call

## 2011-09-01 NOTE — Telephone Encounter (Signed)
He would need to come and get an EKG today.

## 2011-09-03 ENCOUNTER — Ambulatory Visit (HOSPITAL_COMMUNITY): Admission: RE | Admit: 2011-09-03 | Payer: Medicare Other | Source: Ambulatory Visit | Admitting: Cardiology

## 2011-09-03 ENCOUNTER — Encounter (HOSPITAL_COMMUNITY): Admission: RE | Payer: Self-pay | Source: Ambulatory Visit

## 2011-09-03 SURGERY — CARDIOVERSION
Anesthesia: General

## 2011-09-04 NOTE — Progress Notes (Signed)
Addended by: Judithe Modest D on: 09/04/2011 11:28 AM   Modules accepted: Orders

## 2011-09-07 ENCOUNTER — Other Ambulatory Visit: Payer: Self-pay | Admitting: Cardiology

## 2011-09-19 ENCOUNTER — Encounter: Payer: Self-pay | Admitting: Nurse Practitioner

## 2011-09-19 ENCOUNTER — Ambulatory Visit (INDEPENDENT_AMBULATORY_CARE_PROVIDER_SITE_OTHER): Payer: Medicare Other | Admitting: Nurse Practitioner

## 2011-09-19 VITALS — BP 138/84 | HR 56 | Ht 73.0 in | Wt 250.0 lb

## 2011-09-19 DIAGNOSIS — I480 Paroxysmal atrial fibrillation: Secondary | ICD-10-CM

## 2011-09-19 DIAGNOSIS — I4891 Unspecified atrial fibrillation: Secondary | ICD-10-CM

## 2011-09-19 DIAGNOSIS — R079 Chest pain, unspecified: Secondary | ICD-10-CM

## 2011-09-19 DIAGNOSIS — R0789 Other chest pain: Secondary | ICD-10-CM | POA: Insufficient documentation

## 2011-09-19 NOTE — Assessment & Plan Note (Signed)
He reports some atypical chest pain. He does have multiple CV risk factors. His last stress test was in 2009. He is not really wanting to repeat his stress test at this time. I have encouraged him to walk for at least 30 minutes a day. We will see him back in a month to reassess. Patient is agreeable to this plan and will call if any problems develop in the interim.

## 2011-09-19 NOTE — Patient Instructions (Signed)
I want you to walk every day  We will see you in a month.  Let us know if your chest discomfort gets any worse  Call the [facility] office at [phone] if you have any questions, problems or concerns.

## 2011-09-19 NOTE — Progress Notes (Signed)
Henry Barnett Date of Birth: [birth year] Medical Record #[id number]  History of Present Illness: Henry Barnett is seen today for a follow up visit. He is seen for Dr. Swaziland. He has had recurrent atrial fibrillation of uncertain duration. He has been placed on anticoagulation and had his Flecainide restarted. He converted on his own and did not require cardioversion.  He comes in today. He is here alone. He thinks he is doing ok. He does still note fatigue. Hasn't been very active. Does report some chest tightness over the left side of his chest that occurs at rest. Says it can last for hours. Not really worse with exertion and he has no other associated symptoms. He has had a recent URI. He reports low testosterone levels as well and can't afford the medicine. Long term use of Xarelto may be an issue as well. He does report being fatigued. This really hasn't improved with restoration of sinus rhythm.   Current Outpatient Prescriptions on File Prior to Visit  Medication Sig Dispense Refill  . acetaminophen (TYLENOL) 500 MG tablet Take 500 mg by mouth every 6 (six) hours as needed. For pain      . fish oil-omega-3 fatty acids 1000 MG capsule Take 2 g by mouth 2 (two) times daily.        . flecainide (TAMBOCOR) 100 MG tablet Take 1 tablet (100 mg total) by mouth 2 (two) times daily.  60 tablet  6  . fluticasone (FLONASE) 50 MCG/ACT nasal spray Place 1 spray into the nose daily.      . Rivaroxaban (XARELTO) 20 MG TABS Take 20 mg by mouth daily.  30 tablet  6  . Sildenafil Citrate (VIAGRA PO) Take 1 tablet by mouth as needed. For erectile dysfunction      . simvastatin (ZOCOR) 20 MG tablet TAKE ONE TABLET BY MOUTH EVERY DAY AT BEDTIME  30 tablet  11  . telmisartan-hydrochlorothiazide (MICARDIS HCT) 40-12.5 MG per tablet Take 1 tablet by mouth daily.        Allergies  Allergen Reactions  . Ace Inhibitors Cough    Past Medical History  Diagnosis Date  . Hypertension   . Hypercholesteremia   .  Carotid stenosis, left     moderate Left carotid stenosis  . Paroxysmal a-fib     on Flecainide; converted spontaneously and cardioverted cancelled  . Chronic anticoagulation     Xarelto started 07/11/11  . Normal nuclear stress test Oct 2009    No ischemia with EF of 63%  . H/O: rheumatic fever   . Abnormal echocardiogram Sept 2009    Mild LVH, Mild mitral and tricuspid insufficiency and mild LAE. EF was 55 to 60%.; Echo Feb 2013 with EF 45 to 50% and biatrial enlargement  . Abnormal echocardiogram Feb 2013    biatrial enlargement. EF 45 to 50%.     Past Surgical History  Procedure Date  . Inguinal hernia repair     Status post left inguinal hernia repair  . Cholecystectomy     History  Smoking status  . Former Smoker  . Quit date: 01/31/1975  Smokeless tobacco  . Not on file    History  Alcohol Use No    Family History  Problem Relation Age of Onset  . Heart disease Mother   . Stroke Mother     Review of Systems: The review of systems is per the HPI.  All other systems were reviewed and are negative.  Physical Exam: BP 138/84  Pulse 56  Ht 6\' 1"  (1.854 m)  Wt 250 lb (113.399 kg)  BMI 32.98 kg/m2 Patient is very pleasant and in no acute distress. He is obese. Skin is warm and dry. Color is normal.  HEENT is unremarkable. Normocephalic/atraumatic. PERRL. Sclera are nonicteric. Neck is supple. No masses. No JVD. Lungs are clear. Cardiac exam shows a regular rate and rhythm. Abdomen is obese but soft. Extremities are without edema. Gait and ROM are intact. No gross neurologic deficits noted.   LABORATORY DATA: 1st EKG done this morning was concerning for junctional bradycardia. Repeat EKG showed better P waves and is felt to be sinus bradycardia. Tracings are reviewed with Dr. Swaziland.   Assessment / Plan:

## 2011-09-19 NOTE — Assessment & Plan Note (Signed)
He remains in sinus. We will leave him on his current regimen. Long term issues with cost and Xarelto may be an issue. Samples are given today of his Xarelto. We will plan on seeing him back in a month.

## 2011-10-20 ENCOUNTER — Encounter: Payer: Self-pay | Admitting: Nurse Practitioner

## 2011-10-20 ENCOUNTER — Ambulatory Visit (INDEPENDENT_AMBULATORY_CARE_PROVIDER_SITE_OTHER): Payer: Medicare Other | Admitting: Nurse Practitioner

## 2011-10-20 VITALS — BP 138/86 | HR 56 | Ht 73.0 in | Wt 248.0 lb

## 2011-10-20 DIAGNOSIS — I6529 Occlusion and stenosis of unspecified carotid artery: Secondary | ICD-10-CM

## 2011-10-20 DIAGNOSIS — I10 Essential (primary) hypertension: Secondary | ICD-10-CM

## 2011-10-20 DIAGNOSIS — I6522 Occlusion and stenosis of left carotid artery: Secondary | ICD-10-CM

## 2011-10-20 DIAGNOSIS — I480 Paroxysmal atrial fibrillation: Secondary | ICD-10-CM

## 2011-10-20 DIAGNOSIS — I4891 Unspecified atrial fibrillation: Secondary | ICD-10-CM

## 2011-10-20 MED ORDER — ASPIRIN 325 MG PO TABS: 325.0000 mg | ORAL_TABLET | Freq: Every day | ORAL | Status: AC

## 2011-10-20 NOTE — Assessment & Plan Note (Addendum)
No symptoms reported. Probably needs a repeat duplex scheduled on his return visit. His last study was in 2011.

## 2011-10-20 NOTE — Patient Instructions (Signed)
Stay on your current medicines. But we are stopping your Xarelto. Resume your aspirin at 325 mg daily.  We will see you in 4 months.  Check your pulse each day.  Call the [facility] office at [phone] if you have any questions, problems or concerns.

## 2011-10-20 NOTE — Progress Notes (Signed)
Henry Barnett Date of Birth: 09/24/1945 Medical Record #[id number]  History of Present Illness: Henry Barnett is seen back today for a one month check. He is seen for Dr. Swaziland. He has had recurrent atrial fib and was put on Xarelto and had his Flecainide restarted. He converted on his own. His other issues include HTN, obesity, ED, HLD, carotid disease, mild valvular heart disease and a normal stress test back in 2009.  He comes in today. He is here alone. He is doing well. No complaints. He has had no more chest pain. He is walking 30 minutes daily. Has lost 2 pounds. Feels better overall. He is not going to be able to afford Xarelto. CHADs score is 1. CHADsvasc score is 3 (age, HTN, vascular disease).  Current Outpatient Prescriptions on File Prior to Visit  Medication Sig Dispense Refill  . acetaminophen (TYLENOL) 500 MG tablet Take 500 mg by mouth every 6 (six) hours as needed. For pain      . fish oil-omega-3 fatty acids 1000 MG capsule Take 2 g by mouth 2 (two) times daily.        . flecainide (TAMBOCOR) 100 MG tablet Take 1 tablet (100 mg total) by mouth 2 (two) times daily.  60 tablet  6  . fluticasone (FLONASE) 50 MCG/ACT nasal spray Place 1 spray into the nose daily.      . Sildenafil Citrate (VIAGRA PO) Take 1 tablet by mouth as needed. For erectile dysfunction      . simvastatin (ZOCOR) 20 MG tablet TAKE ONE TABLET BY MOUTH EVERY DAY AT BEDTIME  30 tablet  11  . telmisartan-hydrochlorothiazide (MICARDIS HCT) 40-12.5 MG per tablet Take 1 tablet by mouth daily.        Allergies  Allergen Reactions  . Ace Inhibitors Cough    Past Medical History  Diagnosis Date  . Hypertension   . Hypercholesteremia   . Carotid stenosis, left     moderate Left carotid stenosis  . Paroxysmal a-fib     on Flecainide; converted spontaneously and cardioverted cancelled  . Chronic anticoagulation     Xarelto started 07/11/11  . Normal nuclear stress test Oct 2009    No ischemia with EF of  63%  . H/O: rheumatic fever   . Abnormal echocardiogram Sept 2009    Mild LVH, Mild mitral and tricuspid insufficiency and mild LAE. EF was 55 to 60%.; Echo Feb 2013 with EF 45 to 50% and biatrial enlargement  . Abnormal echocardiogram Feb 2013    biatrial enlargement. EF 45 to 50%.     Past Surgical History  Procedure Date  . Inguinal hernia repair     Status post left inguinal hernia repair  . Cholecystectomy     History  Smoking status  . Former Smoker  . Quit date: 01/31/1975  Smokeless tobacco  . Not on file    History  Alcohol Use No    Family History  Problem Relation Age of Onset  . Heart disease Mother   . Stroke Mother     Review of Systems: The review of systems is per the HPI.  All other systems were reviewed and are negative.  Physical Exam: BP 138/86  Pulse 56  Ht 6\' 1"  (1.854 m)  Wt 248 lb (112.492 kg)  BMI 32.72 kg/m2 Patient is very pleasant and in no acute distress. He is obese. Skin is warm and dry. Color is normal.  HEENT is unremarkable. Normocephalic/atraumatic. PERRL. Sclera are nonicteric. Neck is  supple. No masses. No JVD. Lungs are clear. Cardiac exam shows a regular rate and rhythm. Abdomen is obese but soft. Extremities are without edema. Gait and ROM are intact. No gross neurologic deficits noted.   LABORATORY DATA:   Assessment / Plan:

## 2011-10-20 NOTE — Assessment & Plan Note (Signed)
Blood pressure looks ok. No change with his current regimen. He will continue to monitor at home.

## 2011-10-20 NOTE — Assessment & Plan Note (Signed)
He remains in sinus on physical exam. His CHADs is just a one (HTN). I have stopped his Xarelto and restarted full strength aspirin. He is going to monitor his pulse on a daily basis. He understands that if he has recurrence of his atrial fib, then long term anticoagulation is in order. We will see him back in 4 months with a repeat EKG on return. Patient is agreeable to this plan and will call if any problems develop in the interim.

## 2012-01-07 ENCOUNTER — Other Ambulatory Visit: Payer: Self-pay | Admitting: Cardiology

## 2012-01-07 NOTE — Telephone Encounter (Signed)
Refilled micardis.

## 2012-03-03 ENCOUNTER — Encounter: Payer: Self-pay | Admitting: Cardiology

## 2012-03-03 ENCOUNTER — Ambulatory Visit (INDEPENDENT_AMBULATORY_CARE_PROVIDER_SITE_OTHER): Payer: Medicare Other | Admitting: Cardiology

## 2012-03-03 VITALS — BP 138/80 | HR 60 | Ht 73.0 in | Wt 243.0 lb

## 2012-03-03 DIAGNOSIS — I6529 Occlusion and stenosis of unspecified carotid artery: Secondary | ICD-10-CM

## 2012-03-03 DIAGNOSIS — I6522 Occlusion and stenosis of left carotid artery: Secondary | ICD-10-CM

## 2012-03-03 DIAGNOSIS — I4891 Unspecified atrial fibrillation: Secondary | ICD-10-CM

## 2012-03-03 DIAGNOSIS — E7800 Pure hypercholesterolemia, unspecified: Secondary | ICD-10-CM

## 2012-03-03 DIAGNOSIS — I480 Paroxysmal atrial fibrillation: Secondary | ICD-10-CM

## 2012-03-03 DIAGNOSIS — I10 Essential (primary) hypertension: Secondary | ICD-10-CM

## 2012-03-03 LAB — BASIC METABOLIC PANEL
BUN: 18 mg/dL (ref 6–23)
CO2: 26 mEq/L (ref 19–32)
Calcium: 9 mg/dL (ref 8.4–10.5)
GFR: 93.83 mL/min (ref >60.00)
Glucose, Bld: 108 mg/dL — ABNORMAL HIGH (ref 70–99)
Potassium: 3.9 mEq/L (ref 3.5–5.1)

## 2012-03-03 LAB — LIPID PANEL
Cholesterol: 147 mg/dL (ref 0–200)
HDL: 40.7 mg/dL (ref >39.00)
VLDL: 13 mg/dL (ref 0.0–40.0)

## 2012-03-03 LAB — HEPATIC FUNCTION PANEL: Albumin: 4.2 g/dL (ref 3.5–5.2)

## 2012-03-03 NOTE — Progress Notes (Signed)
Henry Barnett Date of Birth: 09-Sep-1945 Medical Record #[id number]  History of Present Illness: Henry Barnett is seen back today for a followup visit. He has had recurrent atrial fib and  had his Flecainide restarted. He converted on his own. His other issues include HTN, obesity, ED, HLD, carotid disease, mild valvular heart disease and a normal stress test back in 2009. He reports that he is doing very well on followup today. He denies any palpitations, shortness of breath, or chest pain. He was on AndroGel for a period of time for low testosterone. He noted some improvement in erectile dysfunction but states that his lack of energy was about the same so he stopped the medication. He does have a history of carotid disease with a 40-59% left ICA stenosis by his last Doppler in May of 2012.  Current Outpatient Prescriptions on File Prior to Visit  Medication Sig Dispense Refill  . acetaminophen (TYLENOL) 500 MG tablet Take 500 mg by mouth every 6 (six) hours as needed. For pain      . aspirin (BAYER ASPIRIN) 325 MG tablet Take 1 tablet (325 mg total) by mouth daily.  100 tablet  3  . fish oil-omega-3 fatty acids 1000 MG capsule Take 2 g by mouth 2 (two) times daily.        . flecainide (TAMBOCOR) 100 MG tablet Take 1 tablet (100 mg total) by mouth 2 (two) times daily.  60 tablet  6  . fluticasone (FLONASE) 50 MCG/ACT nasal spray Place 1 spray into the nose daily.      Marland Kitchen MICARDIS HCT 40-12.5 MG per tablet TAKE ONE TABLET BY MOUTH EVERY DAY  30 each  5  . Sildenafil Citrate (VIAGRA PO) Take 1 tablet by mouth as needed. For erectile dysfunction      . simvastatin (ZOCOR) 20 MG tablet TAKE ONE TABLET BY MOUTH EVERY DAY AT BEDTIME  30 tablet  11    Allergies  Allergen Reactions  . Ace Inhibitors Cough    Past Medical History  Diagnosis Date  . Hypertension   . Hypercholesteremia   . Carotid stenosis, left     moderate Left carotid stenosis  . Paroxysmal a-fib     on Flecainide; converted  spontaneously and cardioverted cancelled  . Normal nuclear stress test Oct 2009    No ischemia with EF of 63%  . H/O: rheumatic fever   . Abnormal echocardiogram Feb 2013    biatrial enlargement. EF 45 to 50%.     Past Surgical History  Procedure Date  . Inguinal hernia repair     Status post left inguinal hernia repair  . Cholecystectomy     History  Smoking status  . Former Smoker  . Quit date: 01/31/1975  Smokeless tobacco  . Not on file    History  Alcohol Use No    Family History  Problem Relation Age of Onset  . Heart disease Mother   . Stroke Mother     Review of Systems: The review of systems is per the HPI.  All other systems were reviewed and are negative.  Physical Exam: BP 138/80  Pulse 60  Ht 6\' 1"  (1.854 m)  Wt 243 lb (110.224 kg)  BMI 32.06 kg/m2 Patient is very pleasant and in no acute distress. He is obese. Skin is warm and dry. Color is normal.  HEENT is unremarkable. Normocephalic/atraumatic. PERRL. Sclera are nonicteric. Neck is supple. No masses. No JVD. Lungs are clear. Cardiac exam shows a regular  rate and rhythm. Abdomen is obese but soft. Extremities are without edema. Gait and ROM are intact. No gross neurologic deficits noted.   LABORATORY DATA:   Assessment / Plan: 1. Atrial fibrillation, well controlled on flecainide. [place] score was 1. We will forego anticoagulation therapy at this time.  2. Carotid arterial disease. We'll schedule for repeat carotid duplex.  3. Hypertension, well controlled.

## 2012-03-03 NOTE — Patient Instructions (Signed)
We will check fasting lab work today and call with the results.  We will schedule you for carotid dopplers.  I will see you in 6 months.

## 2012-03-12 ENCOUNTER — Encounter (INDEPENDENT_AMBULATORY_CARE_PROVIDER_SITE_OTHER): Payer: Medicare Other

## 2012-03-12 DIAGNOSIS — I10 Essential (primary) hypertension: Secondary | ICD-10-CM

## 2012-03-12 DIAGNOSIS — I480 Paroxysmal atrial fibrillation: Secondary | ICD-10-CM

## 2012-03-12 DIAGNOSIS — I6522 Occlusion and stenosis of left carotid artery: Secondary | ICD-10-CM

## 2012-03-12 DIAGNOSIS — E7800 Pure hypercholesterolemia, unspecified: Secondary | ICD-10-CM

## 2012-03-12 DIAGNOSIS — I6529 Occlusion and stenosis of unspecified carotid artery: Secondary | ICD-10-CM

## 2012-03-18 ENCOUNTER — Other Ambulatory Visit: Payer: Self-pay

## 2012-03-18 DIAGNOSIS — I6529 Occlusion and stenosis of unspecified carotid artery: Secondary | ICD-10-CM

## 2012-03-27 ENCOUNTER — Other Ambulatory Visit: Payer: Self-pay | Admitting: Nurse Practitioner

## 2012-07-19 ENCOUNTER — Other Ambulatory Visit: Payer: Self-pay | Admitting: Nurse Practitioner

## 2012-07-31 ENCOUNTER — Other Ambulatory Visit: Payer: Self-pay

## 2012-08-30 ENCOUNTER — Encounter: Payer: Self-pay | Admitting: Cardiology

## 2012-08-30 ENCOUNTER — Ambulatory Visit (INDEPENDENT_AMBULATORY_CARE_PROVIDER_SITE_OTHER): Payer: Medicare Other | Admitting: Cardiology

## 2012-08-30 VITALS — BP 130/78 | HR 60 | Ht 73.0 in | Wt 247.8 lb

## 2012-08-30 DIAGNOSIS — E7800 Pure hypercholesterolemia, unspecified: Secondary | ICD-10-CM

## 2012-08-30 DIAGNOSIS — I6529 Occlusion and stenosis of unspecified carotid artery: Secondary | ICD-10-CM

## 2012-08-30 DIAGNOSIS — I10 Essential (primary) hypertension: Secondary | ICD-10-CM

## 2012-08-30 DIAGNOSIS — I4891 Unspecified atrial fibrillation: Secondary | ICD-10-CM

## 2012-08-30 DIAGNOSIS — I6522 Occlusion and stenosis of left carotid artery: Secondary | ICD-10-CM

## 2012-08-30 DIAGNOSIS — I480 Paroxysmal atrial fibrillation: Secondary | ICD-10-CM

## 2012-08-30 DIAGNOSIS — R0789 Other chest pain: Secondary | ICD-10-CM

## 2012-08-30 MED ORDER — SIMVASTATIN 20 MG PO TABS: 20.0000 mg | ORAL_TABLET | Freq: Every day | ORAL | Status: DC

## 2012-08-30 MED ORDER — FLECAINIDE ACETATE 100 MG PO TABS: 100.0000 mg | ORAL_TABLET | Freq: Two times a day (BID) | ORAL | Status: DC

## 2012-08-30 MED ORDER — FLUTICASONE PROPIONATE 50 MCG/ACT NA SUSP: 1.0000 | Freq: Every day | NASAL | Status: DC

## 2012-08-30 MED ORDER — TELMISARTAN-HCTZ 40-12.5 MG PO TABS: 1.0000 | ORAL_TABLET | Freq: Every day | ORAL | Status: DC

## 2012-08-30 NOTE — Progress Notes (Signed)
Henry Barnett Date of Birth: [birth year] Medical Record #[id number]  History of Present Illness: [name] is seen back today for a followup visit. He has a history of paroxysmal atrial fibrillation and is on flecainide. His other issues include HTN, obesity, ED, HLD, carotid disease. He does have a history of carotid disease with a 40-59% left ICA stenosis by his last Doppler in September 2013. On followup today he reports that he is doing well. He has experienced some symptoms of chest tightness or pressure in his left pectoral region that radiates to his left back. His does not appear to be related to activity. On one occasion the back pain lasted for a couple of days. This is a new symptom for him. His last stress test was in 2009.  Current Outpatient Prescriptions on File Prior to Visit  Medication Sig Dispense Refill  . acetaminophen (TYLENOL) 500 MG tablet Take 500 mg by mouth every 6 (six) hours as needed. For pain      . aspirin (BAYER ASPIRIN) 325 MG tablet Take 1 tablet (325 mg total) by mouth daily.  100 tablet  3  . fish oil-omega-3 fatty acids 1000 MG capsule Take 2 g by mouth 2 (two) times daily.        . flecainide (TAMBOCOR) 100 MG tablet TAKE ONE TABLET BY MOUTH TWICE DAILY  60 tablet  9  . fluticasone (FLONASE) 50 MCG/ACT nasal spray Place 1 spray into the nose daily.      Marland Kitchen MICARDIS HCT 40-12.5 MG per tablet TAKE ONE TABLET BY MOUTH EVERY DAY  30 tablet  6  . Multiple Vitamin (MULTIVITAMIN) tablet Take 1 tablet by mouth daily.      . Sildenafil Citrate (VIAGRA PO) Take 1 tablet by mouth as needed. For erectile dysfunction      . simvastatin (ZOCOR) 20 MG tablet TAKE ONE TABLET BY MOUTH EVERY DAY AT BEDTIME  30 tablet  11   No current facility-administered medications on file prior to visit.    Allergies  Allergen Reactions  . Ace Inhibitors Cough    Past Medical History  Diagnosis Date  . Hypertension   . Hypercholesteremia   . Carotid stenosis, left    moderate Left carotid stenosis  . Paroxysmal a-fib     on Flecainide; converted spontaneously and cardioverted cancelled  . Normal nuclear stress test Oct 2009    No ischemia with EF of 63%  . H/O: rheumatic fever   . Abnormal echocardiogram Feb 2013    biatrial enlargement. EF 45 to 50%.     Past Surgical History  Procedure Laterality Date  . Inguinal hernia repair      Status post left inguinal hernia repair  . Cholecystectomy      History  Smoking status  . Former Smoker  . Quit date: 01/31/1975  Smokeless tobacco  . Not on file    History  Alcohol Use No    Family History  Problem Relation Age of Onset  . Heart disease Mother   . Stroke Mother     Review of Systems: The review of systems is per the HPI.  All other systems were reviewed and are negative.  Physical Exam: BP 130/78  Pulse 60  Ht 6\' 1"  (1.854 m)  Wt 247 lb 12.8 oz (112.401 kg)  BMI 32.7 kg/m2  SpO2 98% Patient is very pleasant and in no acute distress. Weight is up by 4 pounds. He is obese. Skin is warm and dry. Color  is normal.  HEENT is unremarkable. Normocephalic/atraumatic. PERRL. Sclera are nonicteric. Neck is supple. No masses. No JVD. Lungs are clear. Cardiac exam shows a regular rate and rhythm. Abdomen is obese but soft. Extremities are without edema. Gait and ROM are intact. No gross neurologic deficits noted.   LABORATORY DATA: ECG today demonstrates normal sinus rhythm with a rate of 55 beats per minute. It is otherwise normal.  Assessment / Plan: 1. Atrial fibrillation, well controlled on flecainide. [place] score was 1. We will forego anticoagulation therapy at this time. Continue aspirin.  2. Carotid arterial disease. Repeat carotid Dopplers in 6 months.  3. Hypertension, well controlled.  4. Chest tightness/pressure. We will update a stress Myoview.

## 2012-08-30 NOTE — Patient Instructions (Signed)
Continue your current therapy  We will schedule you for a nuclear stress test.  Otherwise I will see you in 6 months with fasting lab work.

## 2012-09-06 ENCOUNTER — Ambulatory Visit (HOSPITAL_COMMUNITY): Payer: Medicare Other | Attending: Cardiovascular Disease | Admitting: Radiology

## 2012-09-06 VITALS — BP 114/74 | Ht 73.0 in | Wt 248.0 lb

## 2012-09-06 DIAGNOSIS — I10 Essential (primary) hypertension: Secondary | ICD-10-CM

## 2012-09-06 DIAGNOSIS — E7800 Pure hypercholesterolemia, unspecified: Secondary | ICD-10-CM | POA: Insufficient documentation

## 2012-09-06 DIAGNOSIS — I480 Paroxysmal atrial fibrillation: Secondary | ICD-10-CM

## 2012-09-06 DIAGNOSIS — R0789 Other chest pain: Secondary | ICD-10-CM

## 2012-09-06 DIAGNOSIS — I491 Atrial premature depolarization: Secondary | ICD-10-CM

## 2012-09-06 DIAGNOSIS — I4891 Unspecified atrial fibrillation: Secondary | ICD-10-CM | POA: Insufficient documentation

## 2012-09-06 DIAGNOSIS — I6522 Occlusion and stenosis of left carotid artery: Secondary | ICD-10-CM

## 2012-09-06 MED ORDER — TECHNETIUM TC 99M SESTAMIBI GENERIC - CARDIOLITE
30.0000 | Freq: Once | INTRAVENOUS | Status: AC | PRN
  Administered 2012-09-06: 30 via INTRAVENOUS

## 2012-09-06 MED ORDER — TECHNETIUM TC 99M SESTAMIBI GENERIC - CARDIOLITE
10.0000 | Freq: Once | INTRAVENOUS | Status: AC | PRN
  Administered 2012-09-06: 10 via INTRAVENOUS

## 2012-09-06 NOTE — Progress Notes (Signed)
[facility] [address] NUCLEAR MED [address] [place], [place] [address] [phone]    Cardiology Nuclear Med Study  Henry Barnett is a 67 y.o. male     MRN : [id number]     DOB: [birth year]  Procedure Date: 09/06/2012  Nuclear Med Background Indication for Stress Test:  Evaluation for Ischemia History:  H/o AFIB, '09 MPS: (-) ischemia EF: 63% 07/15/11 EF: 45-50% Cardiac Risk Factors: Carotid Disease, History of Smoking, Hypertension and Lipids  Symptoms:  Chest Pressure, Chest Tightness, DOE and Palpitations   Nuclear Pre-Procedure Caffeine/Decaff Intake:  None NPO After: 9:00pm   Lungs:  clear O2 Sat: 98% on room air. IV 0.9% NS with Angio Cath:  22g  IV Site: L Hand  IV Started by:  Stanton Kidney, EMT-P  Chest Size (in):  48 Cup Size: n/a  Height: 6\' 1"  (1.854 m)  Weight:  248 lb (112.492 kg)  BMI:  Body mass index is 32.73 kg/(m^2). Tech Comments:  Meds were taken as directed, per patient.    Nuclear Med Study 1 or 2 day study: 1 day  Stress Test Type:  Stress  Reading MD: Charlton Haws, MD  Order Authorizing Provider:  P.Jordan MD  Resting Radionuclide: Technetium 52m Sestamibi  Resting Radionuclide Dose: 11.0 mCi   Stress Radionuclide:  Technetium 45m Sestamibi  Stress Radionuclide Dose: 33.0 mCi           Stress Protocol Rest HR: 55 Stress HR: 137  Rest BP: 114/74 Stress BP: 196/73  Exercise Time (min): 7:05 METS: 8.60   Predicted Max HR: 151 bpm % Max HR: 90.73 bpm Rate Pressure Product: 46962   Dose of Adenosine (mg):  n/a Dose of Lexiscan: n/a mg  Dose of Atropine (mg): n/a Dose of Dobutamine: n/a mcg/kg/min (at max HR)  Stress Test Technologist: Milana Na, EMT-P  Nuclear Technologist:  Domenic Polite, CNMT     Rest Procedure:  Myocardial perfusion imaging was performed at rest 45 minutes following the intravenous administration of Technetium 49m Sestamibi. Rest ECG: NSR - Normal EKG  Stress Procedure:  The patient exercised on the  treadmill utilizing the Bruce Protocol for 7:05 minutes. The patient stopped due to fatigue occ pvcs and denied any chest pain.  Technetium 78m Sestamibi was injected at peak exercise and myocardial perfusion imaging was performed after a brief delay. Stress ECG: No significant change from baseline ECG  QPS Raw Data Images:  Normal; no motion artifact; normal heart/lung ratio. Stress Images:  Normal homogeneous uptake in all areas of the myocardium. Rest Images:  Normal homogeneous uptake in all areas of the myocardium. Subtraction (SDS):  Normal Transient Ischemic Dilatation (Normal <1.22):  1.04 Lung/Heart Ratio (Normal <0.45):  0.31  Quantitative Gated Spect Images QGS EDV:  112 ml QGS ESV:  42 ml  Impression Exercise Capacity:  Fair exercise capacity. BP Response:  Normal blood pressure response. Clinical Symptoms:  No significant symptoms noted. ECG Impression:  No significant ST segment change suggestive of ischemia. Comparison with Prior Nuclear Study: No images to compare  Overall Impression:  Normal stress nuclear study.  LV Ejection Fraction: 62%.  LV Wall Motion:  NL LV Function; NL Wall Motion  Charlton Haws

## 2012-10-17 IMAGING — CR DG CHEST 2V
2 series · 2 of 2 positions shown · non-contrast
Comparison: Chest x-ray of 03/10/2006

CLINICAL DATA: Preop for cardioversion

CHEST - 2 VIEW

[view not recorded (1 of 2)]
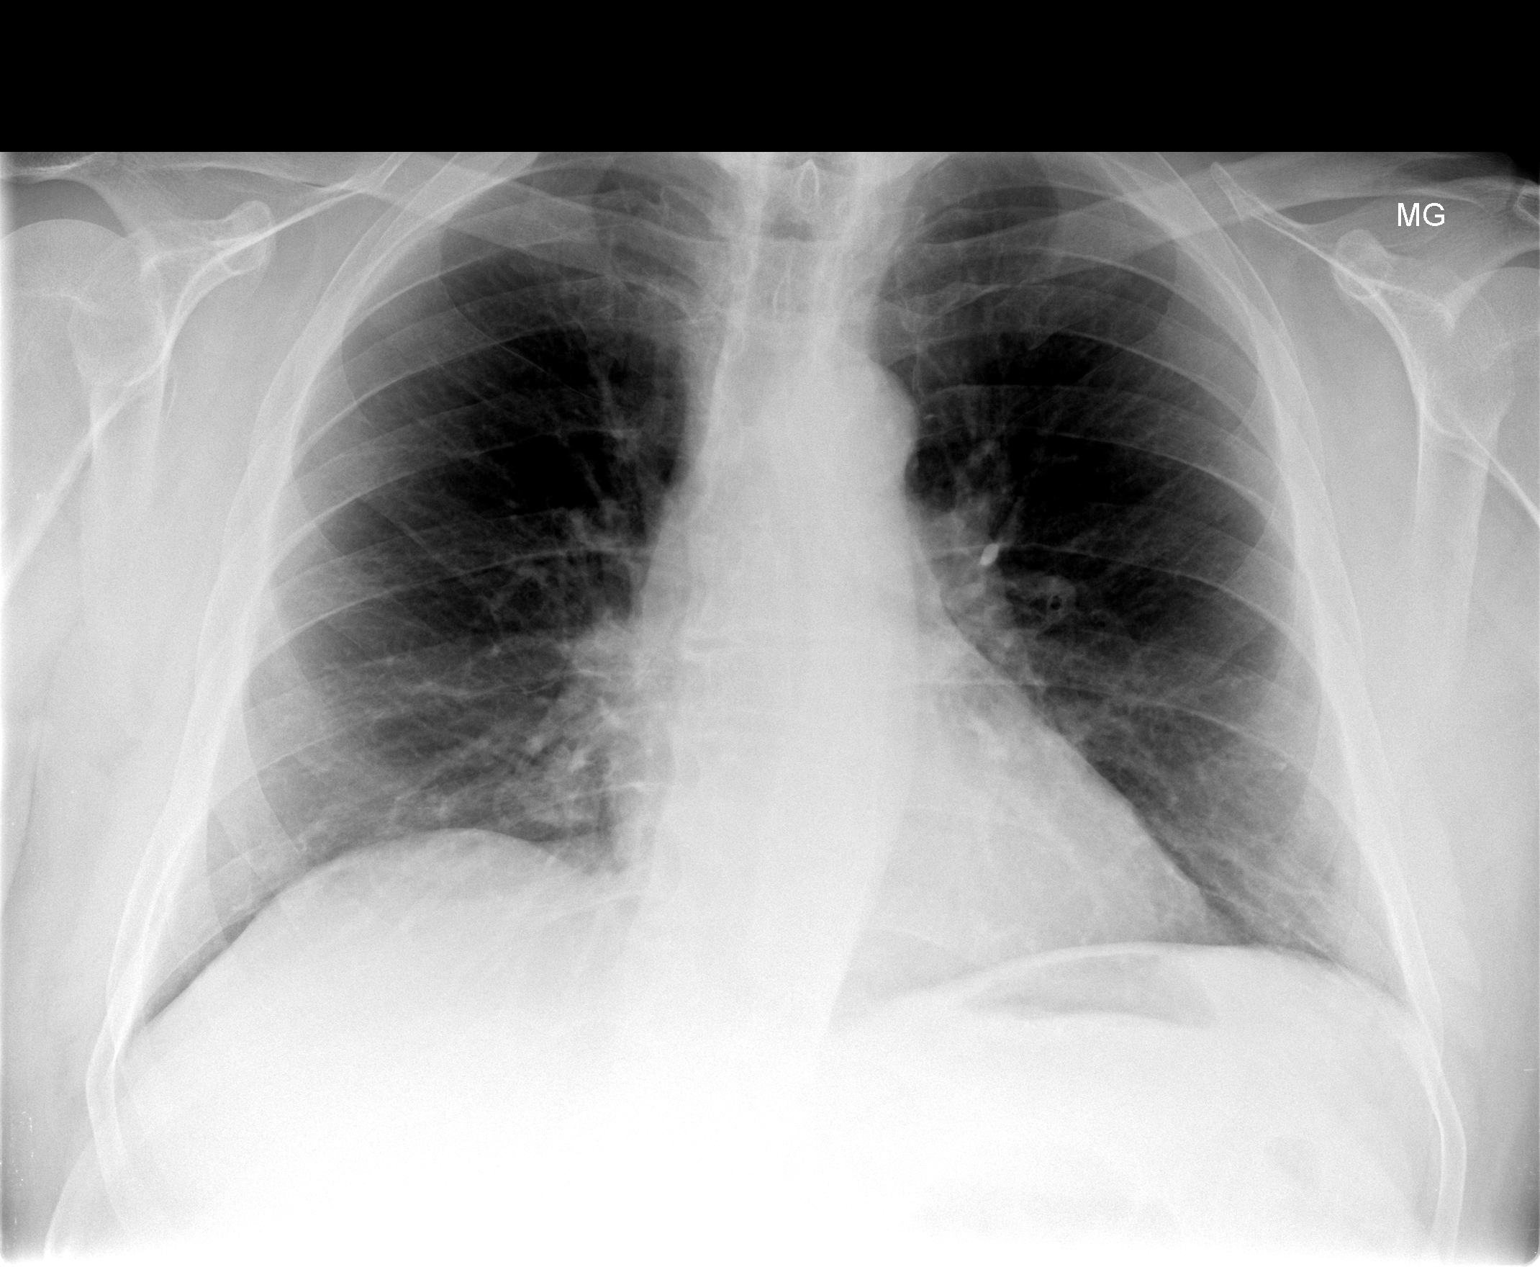

[view not recorded (2 of 2)]
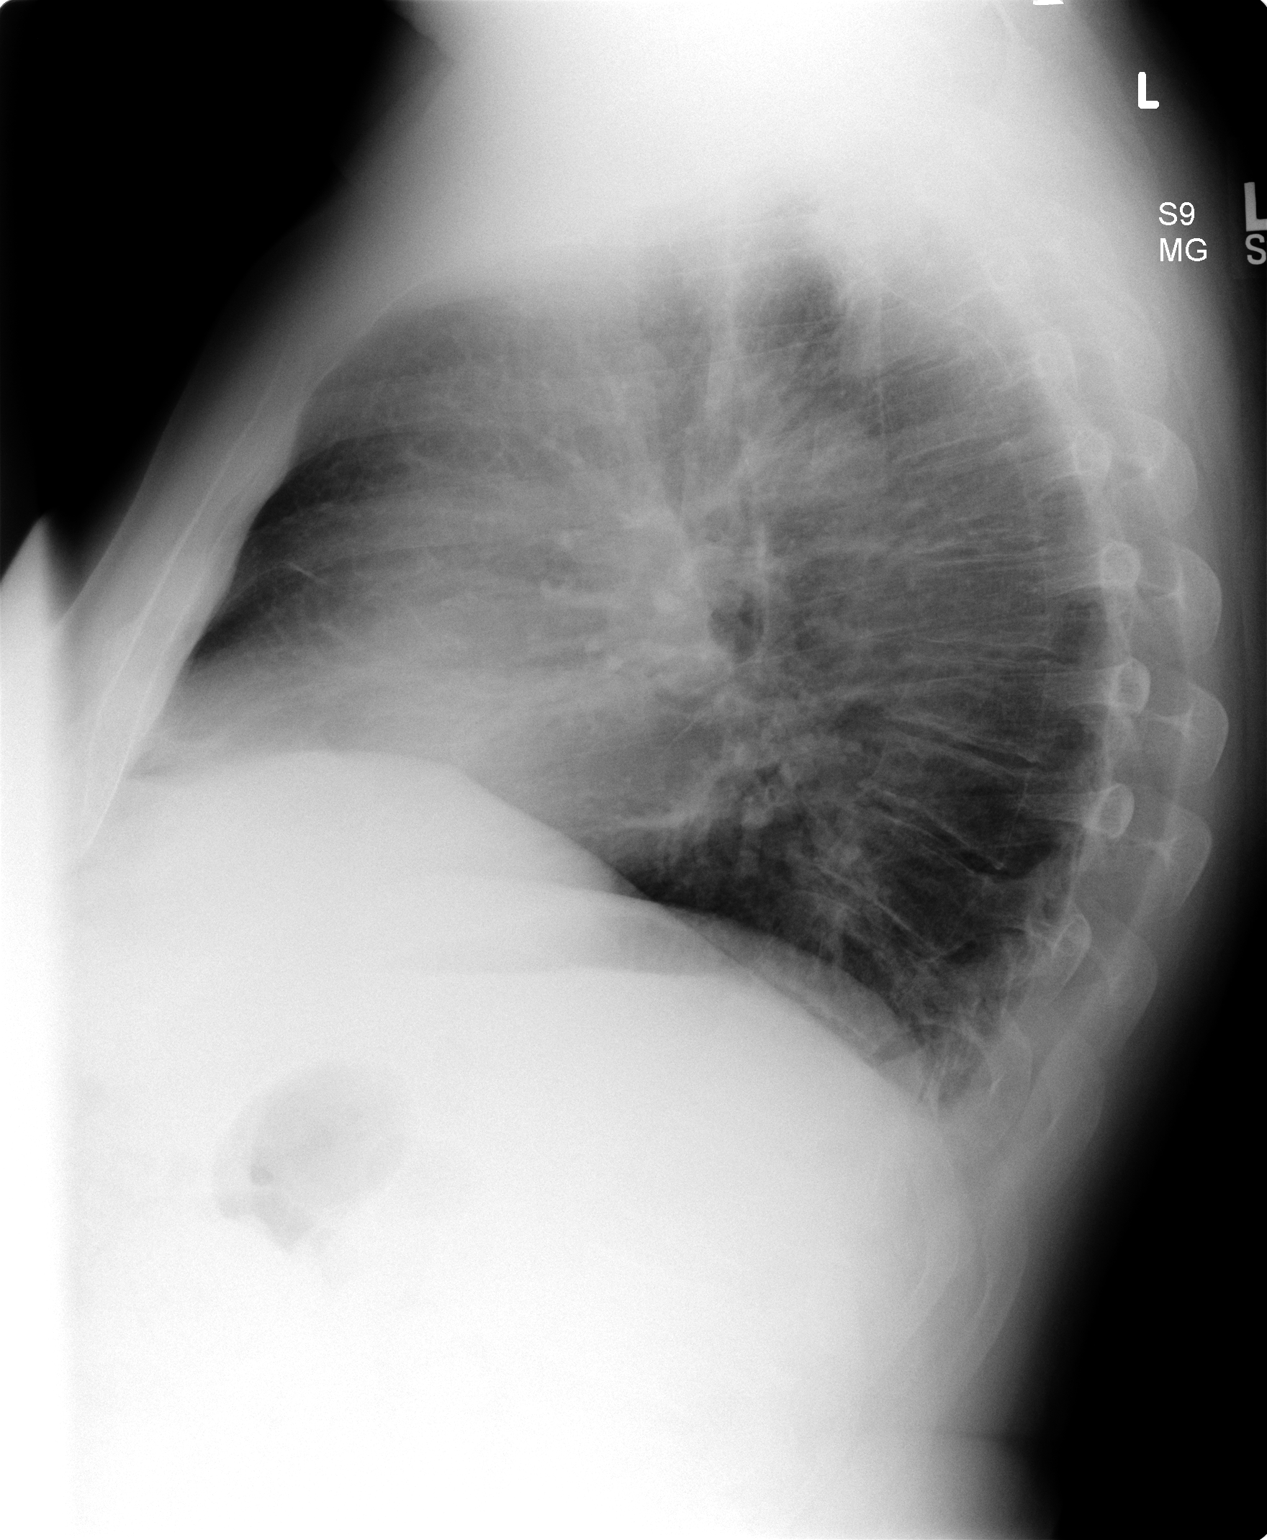

[2 of 2 positions shown; findings below may reference images not displayed]

FINDINGS: The lungs are clear.  There is mild peribronchial
thickening present.  Mild cardiomegaly is stable.  No acute bony
abnormality is seen with degenerative change noted in the lower
thoracic spine.
IMPRESSION: Stable mild cardiomegaly.  No active lung disease.

## 2012-10-27 ENCOUNTER — Encounter: Payer: Self-pay | Admitting: Cardiology

## 2012-12-22 ENCOUNTER — Telehealth: Payer: Self-pay | Admitting: Cardiology

## 2012-12-22 DIAGNOSIS — E7800 Pure hypercholesterolemia, unspecified: Secondary | ICD-10-CM

## 2012-12-22 NOTE — Telephone Encounter (Signed)
New Prob     Labs scheduled for 9/16. Orders needed in EPIC.

## 2012-12-24 NOTE — Telephone Encounter (Signed)
Lab order entered in chart for fasting bmet,lipids,hepatic for 03/01/13.

## 2013-01-19 ENCOUNTER — Other Ambulatory Visit: Payer: Self-pay

## 2013-03-01 ENCOUNTER — Other Ambulatory Visit (INDEPENDENT_AMBULATORY_CARE_PROVIDER_SITE_OTHER): Payer: Medicare Other

## 2013-03-01 DIAGNOSIS — E7800 Pure hypercholesterolemia, unspecified: Secondary | ICD-10-CM

## 2013-03-01 LAB — LIPID PANEL
HDL: 40.9 mg/dL (ref >39.00)
Total CHOL/HDL Ratio: 4
Triglycerides: 154 mg/dL — ABNORMAL HIGH (ref 0.0–149.0)

## 2013-03-01 LAB — BASIC METABOLIC PANEL
Calcium: 9.2 mg/dL (ref 8.4–10.5)
Creatinine, Ser: 0.9 mg/dL (ref 0.4–1.5)
GFR: 85.48 mL/min (ref >60.00)
Sodium: 134 mEq/L — ABNORMAL LOW (ref 135–145)

## 2013-03-01 LAB — HEPATIC FUNCTION PANEL
ALT: 25 U/L (ref 0–53)
AST: 24 U/L (ref 0–37)
Albumin: 4.2 g/dL (ref 3.5–5.2)
Alkaline Phosphatase: 45 U/L (ref 39–117)
Total Bilirubin: 0.7 mg/dL (ref 0.3–1.2)

## 2013-03-16 ENCOUNTER — Ambulatory Visit (INDEPENDENT_AMBULATORY_CARE_PROVIDER_SITE_OTHER): Payer: Medicare Other | Admitting: Cardiology

## 2013-03-16 ENCOUNTER — Ambulatory Visit (HOSPITAL_COMMUNITY): Payer: Medicare Other | Attending: Cardiology

## 2013-03-16 ENCOUNTER — Encounter: Payer: Self-pay | Admitting: Cardiology

## 2013-03-16 VITALS — BP 116/70 | HR 71 | Wt 245.0 lb

## 2013-03-16 DIAGNOSIS — I6522 Occlusion and stenosis of left carotid artery: Secondary | ICD-10-CM

## 2013-03-16 DIAGNOSIS — I480 Paroxysmal atrial fibrillation: Secondary | ICD-10-CM

## 2013-03-16 DIAGNOSIS — I4891 Unspecified atrial fibrillation: Secondary | ICD-10-CM

## 2013-03-16 DIAGNOSIS — F17200 Nicotine dependence, unspecified, uncomplicated: Secondary | ICD-10-CM | POA: Insufficient documentation

## 2013-03-16 DIAGNOSIS — I658 Occlusion and stenosis of other precerebral arteries: Secondary | ICD-10-CM | POA: Insufficient documentation

## 2013-03-16 DIAGNOSIS — E785 Hyperlipidemia, unspecified: Secondary | ICD-10-CM | POA: Insufficient documentation

## 2013-03-16 DIAGNOSIS — I6529 Occlusion and stenosis of unspecified carotid artery: Secondary | ICD-10-CM | POA: Insufficient documentation

## 2013-03-16 DIAGNOSIS — I10 Essential (primary) hypertension: Secondary | ICD-10-CM | POA: Insufficient documentation

## 2013-03-16 DIAGNOSIS — E7800 Pure hypercholesterolemia, unspecified: Secondary | ICD-10-CM

## 2013-03-16 NOTE — Progress Notes (Signed)
Henry Barnett Date of Birth: 03-17-1946 Medical Record #[id number]  History of Present Illness: Henry Barnett is seen back today for a followup visit. He has a history of paroxysmal atrial fibrillation and is on flecainide. His other issues include HTN, obesity, ED, HLD, carotid disease. He does have a history of carotid disease with a 40-59% left ICA stenosis by his last Doppler in September 2013. He had a stress Myoview study in March 2014 which was normal. Today he reports that he is doing very well. He denies any chest pain or shortness of breath. About 3 or 4 weeks ago he experienced some discomfort in his left upper arm without associated chest pain. This was worse when he was lying in bed. He states he doesn't sleep well at night and has to get up 2-5 times per night to urinate. He has no history of snoring.  Current Outpatient Prescriptions on File Prior to Visit  Medication Sig Dispense Refill  . acetaminophen (TYLENOL) 500 MG tablet Take 500 mg by mouth every 6 (six) hours as needed. For pain      . fish oil-omega-3 fatty acids 1000 MG capsule Take 2 g by mouth 2 (two) times daily.        . flecainide (TAMBOCOR) 100 MG tablet Take 1 tablet (100 mg total) by mouth 2 (two) times daily.  180 tablet  3  . fluticasone (FLONASE) 50 MCG/ACT nasal spray Place 1 spray into the nose daily.  1 g  3  . Multiple Vitamin (MULTIVITAMIN) tablet Take 1 tablet by mouth daily.      . Sildenafil Citrate (VIAGRA PO) Take 1 tablet by mouth as needed. For erectile dysfunction      . simvastatin (ZOCOR) 20 MG tablet Take 1 tablet (20 mg total) by mouth at bedtime.  90 tablet  3  . telmisartan-hydrochlorothiazide (MICARDIS HCT) 40-12.5 MG per tablet Take 1 tablet by mouth daily.  90 tablet  3   No current facility-administered medications on file prior to visit.    Allergies  Allergen Reactions  . Ace Inhibitors Cough    Past Medical History  Diagnosis Date  . Hypertension   . Hypercholesteremia   .  Carotid stenosis, left     moderate Left carotid stenosis  . Paroxysmal a-fib     on Flecainide; converted spontaneously and cardioverted cancelled  . Normal nuclear stress test Oct 2009    No ischemia with EF of 63%  . H/O: rheumatic fever   . Abnormal echocardiogram Feb 2013    biatrial enlargement. EF 45 to 50%.     Past Surgical History  Procedure Laterality Date  . Inguinal hernia repair      Status post left inguinal hernia repair  . Cholecystectomy      History  Smoking status  . Former Smoker  . Quit date: 01/31/1975  Smokeless tobacco  . Not on file    History  Alcohol Use No    Family History  Problem Relation Age of Onset  . Heart disease Mother   . Stroke Mother     Review of Systems: The review of systems is per the HPI.  All other systems were reviewed and are negative.  Physical Exam: BP 116/70  Pulse 71  Wt 245 lb (111.131 kg)  BMI 32.33 kg/m2 Patient is very pleasant and in no acute distress.  He is obese. Skin is warm and dry. Color is normal.  HEENT is unremarkable. Normocephalic/atraumatic. PERRL. Sclera are nonicteric. Neck  is supple. No masses. No JVD. Lungs are clear. Cardiac exam shows a regular rate and rhythm. Abdomen is obese but soft. Extremities are without edema. Gait and ROM are intact. No gross neurologic deficits noted.   LABORATORY DATA: Carotid Dopplers are pending today.  Assessment / Plan: 1. Atrial fibrillation, well controlled on flecainide. [place] score was 1. We will forego anticoagulation therapy at this time. Continue aspirin.  2. Carotid arterial disease. Repeat carotid today.  3. Hypertension, well controlled.  4. left arm pain. Atypical for ischemia. He had a normal Myoview study in March. We will continue risk factor modification.

## 2013-03-16 NOTE — Patient Instructions (Signed)
Continue your current therapy  I will see you in 6 months.  We will call with the results of your carotid dopplers.

## 2013-03-18 ENCOUNTER — Other Ambulatory Visit: Payer: Self-pay

## 2013-03-18 DIAGNOSIS — E7800 Pure hypercholesterolemia, unspecified: Secondary | ICD-10-CM

## 2013-03-18 MED ORDER — SIMVASTATIN 40 MG PO TABS: 40.0000 mg | ORAL_TABLET | Freq: Every day | ORAL | Status: DC

## 2013-04-21 ENCOUNTER — Other Ambulatory Visit: Payer: Self-pay

## 2013-06-21 ENCOUNTER — Other Ambulatory Visit (INDEPENDENT_AMBULATORY_CARE_PROVIDER_SITE_OTHER): Payer: Medicare Other

## 2013-06-21 DIAGNOSIS — E7800 Pure hypercholesterolemia, unspecified: Secondary | ICD-10-CM

## 2013-06-21 LAB — HEPATIC FUNCTION PANEL
ALK PHOS: 48 U/L (ref 39–117)
ALT: 27 U/L (ref 0–53)
AST: 25 U/L (ref 0–37)
Albumin: 4 g/dL (ref 3.5–5.2)
BILIRUBIN DIRECT: 0.2 mg/dL (ref 0.0–0.3)
Total Bilirubin: 1 mg/dL (ref 0.3–1.2)
Total Protein: 7.3 g/dL (ref 6.0–8.3)

## 2013-06-21 LAB — LIPID PANEL
CHOL/HDL RATIO: 3
Cholesterol: 111 mg/dL (ref 0–200)
HDL: 38.6 mg/dL — AB (ref >39.00)
LDL Cholesterol: 57 mg/dL (ref 0–99)
TRIGLYCERIDES: 78 mg/dL (ref 0.0–149.0)
VLDL: 15.6 mg/dL (ref 0.0–40.0)

## 2013-06-22 ENCOUNTER — Telehealth: Payer: Self-pay | Admitting: Cardiology

## 2013-06-22 NOTE — Telephone Encounter (Signed)
Returned call to patient lab results given. 

## 2013-06-22 NOTE — Telephone Encounter (Signed)
Follow Up ° °Pt returned call for results. Please call  °

## 2013-08-29 ENCOUNTER — Ambulatory Visit (HOSPITAL_COMMUNITY): Payer: Medicare Other

## 2013-08-29 ENCOUNTER — Encounter (HOSPITAL_COMMUNITY): Payer: Medicare Other

## 2013-09-08 ENCOUNTER — Ambulatory Visit: Payer: Medicare Other | Admitting: Cardiology

## 2013-09-13 ENCOUNTER — Other Ambulatory Visit: Payer: Self-pay | Admitting: *Deleted

## 2013-09-13 DIAGNOSIS — I480 Paroxysmal atrial fibrillation: Secondary | ICD-10-CM

## 2013-09-13 DIAGNOSIS — I6522 Occlusion and stenosis of left carotid artery: Secondary | ICD-10-CM

## 2013-09-13 DIAGNOSIS — E7800 Pure hypercholesterolemia, unspecified: Secondary | ICD-10-CM

## 2013-09-13 DIAGNOSIS — R0789 Other chest pain: Secondary | ICD-10-CM

## 2013-09-13 DIAGNOSIS — I10 Essential (primary) hypertension: Secondary | ICD-10-CM

## 2013-09-13 MED ORDER — TELMISARTAN-HCTZ 40-12.5 MG PO TABS: 1.0000 | ORAL_TABLET | Freq: Every day | ORAL | Status: DC

## 2013-09-27 ENCOUNTER — Other Ambulatory Visit: Payer: Self-pay

## 2013-09-27 ENCOUNTER — Ambulatory Visit (INDEPENDENT_AMBULATORY_CARE_PROVIDER_SITE_OTHER): Payer: Medicare Other | Admitting: Cardiology

## 2013-09-27 ENCOUNTER — Telehealth: Payer: Self-pay

## 2013-09-27 ENCOUNTER — Encounter: Payer: Self-pay | Admitting: Cardiology

## 2013-09-27 ENCOUNTER — Ambulatory Visit (HOSPITAL_COMMUNITY): Payer: Medicare Other | Attending: Cardiology | Admitting: Cardiology

## 2013-09-27 VITALS — BP 120/74 | HR 57 | Ht 73.0 in | Wt 249.0 lb

## 2013-09-27 DIAGNOSIS — I6522 Occlusion and stenosis of left carotid artery: Secondary | ICD-10-CM

## 2013-09-27 DIAGNOSIS — I10 Essential (primary) hypertension: Secondary | ICD-10-CM

## 2013-09-27 DIAGNOSIS — E7800 Pure hypercholesterolemia, unspecified: Secondary | ICD-10-CM

## 2013-09-27 DIAGNOSIS — I6529 Occlusion and stenosis of unspecified carotid artery: Secondary | ICD-10-CM

## 2013-09-27 DIAGNOSIS — I4891 Unspecified atrial fibrillation: Secondary | ICD-10-CM

## 2013-09-27 DIAGNOSIS — I480 Paroxysmal atrial fibrillation: Secondary | ICD-10-CM

## 2013-09-27 MED ORDER — LOSARTAN POTASSIUM-HCTZ 100-12.5 MG PO TABS: 1.0000 | ORAL_TABLET | Freq: Every day | ORAL | Status: DC

## 2013-09-27 NOTE — Telephone Encounter (Signed)
[facility] not [facility] telmisartan/hctz,will cover losartan/hctz 100/12.5 mg.90 day refill sent to pharmacy for losartan/hctz 100/12.5 mg daily.

## 2013-09-27 NOTE — Progress Notes (Signed)
Henry Barnett Date of Birth: 1946-04-06 Medical Record #[id number]  History of Present Illness: [name] is seen back today for a followup visit. He has a history of paroxysmal atrial fibrillation and is on flecainide. His other issues include HTN, obesity, ED, HLD, carotid disease. He does have a history of carotid disease with a 40-59% left ICA stenosis by his last Doppler in September 2013. He had a stress Myoview study in March 2014 which was normal. Today he reports that he is doing very well. He denies any chest pain or shortness of breath. Some HA that he attributes to allergies. Exercises 3 days a week.  Current Outpatient Prescriptions on File Prior to Visit  Medication Sig Dispense Refill  . acetaminophen (TYLENOL) 500 MG tablet Take 500 mg by mouth every 6 (six) hours as needed. For pain      . aspirin 325 MG tablet Take 325 mg by mouth daily.      . fish oil-omega-3 fatty acids 1000 MG capsule Take 2 g by mouth 2 (two) times daily.        . flecainide (TAMBOCOR) 100 MG tablet Take 1 tablet (100 mg total) by mouth 2 (two) times daily.  180 tablet  3  . fluticasone (FLONASE) 50 MCG/ACT nasal spray Place 1 spray into the nose daily.  1 g  3  . Multiple Vitamin (MULTIVITAMIN) tablet Take 1 tablet by mouth daily.      . simvastatin (ZOCOR) 40 MG tablet Take 1 tablet (40 mg total) by mouth at bedtime.  30 tablet  6  . telmisartan-hydrochlorothiazide (MICARDIS HCT) 40-12.5 MG per tablet Take 1 tablet by mouth daily.  90 tablet  0   No current facility-administered medications on file prior to visit.    Allergies  Allergen Reactions  . Ace Inhibitors Cough    Past Medical History  Diagnosis Date  . Hypertension   . Hypercholesteremia   . Carotid stenosis, left     moderate Left carotid stenosis  . Paroxysmal a-fib     on Flecainide; converted spontaneously and cardioverted cancelled  . Normal nuclear stress test Oct 2009    No ischemia with EF of 63%  . H/O: rheumatic  fever   . Abnormal echocardiogram Feb 2013    biatrial enlargement. EF 45 to 50%.     Past Surgical History  Procedure Laterality Date  . Inguinal hernia repair      Status post left inguinal hernia repair  . Cholecystectomy      History  Smoking status  . Former Smoker  . Quit date: 01/31/1975  Smokeless tobacco  . Not on file    History  Alcohol Use No    Family History  Problem Relation Age of Onset  . Heart disease Mother   . Stroke Mother     Review of Systems: The review of systems is per the HPI.  All other systems were reviewed and are negative.  Physical Exam: BP 120/74  Pulse 57  Ht 6\' 1"  (1.854 m)  Wt 249 lb (112.946 kg)  BMI 32.86 kg/m2 Patient is very pleasant and in no acute distress.  He is obese. Skin is warm and dry. Color is normal.  HEENT is unremarkable. Normocephalic/atraumatic. PERRL. Sclera are nonicteric. Neck is supple. No masses. No JVD. Lungs are clear. Cardiac exam shows a regular rate and rhythm. Abdomen is obese but soft. Extremities are without edema. Gait and ROM are intact. No gross neurologic deficits noted.   LABORATORY  DATA: Carotid Dopplers are pending today.  Ecg: NSR, nonspecific ST abnormality.  Lab Results  Component Value Date   WBC 5.4 08/29/2011   HGB 14.3 08/29/2011   HCT 42.5 08/29/2011   PLT 187.0 08/29/2011   GLUCOSE 99 03/01/2013   CHOL 111 06/21/2013   TRIG 78.0 06/21/2013   HDL 38.60* 06/21/2013   LDLCALC 57 06/21/2013   ALT 27 06/21/2013   AST 25 06/21/2013   NA 134* 03/01/2013   K 3.5 03/01/2013   CL 101 03/01/2013   CREATININE 0.9 03/01/2013   BUN 20 03/01/2013   CO2 27 03/01/2013   TSH 3.12 07/11/2011   INR 1.3* 08/29/2011     Assessment / Plan: 1. Atrial fibrillation, well controlled on flecainide. [place] VASC score is 2. We will forego anticoagulation therapy at this time. Continue aspirin. He has not had recurrent Afib on flecainide  2. Carotid arterial disease. Carotid dopplers repeated  today.  3.  Hypertension, well controlled.  4. Hyperlipidemia. Well controlled on simvastatin.  I will follow up in 6 months with fasting lab work.

## 2013-09-27 NOTE — Patient Instructions (Signed)
Continue your current therapy  I will see you in 6 months.  We will call with your doppler report

## 2013-09-27 NOTE — Progress Notes (Signed)
Carotid duplex completed 

## 2013-09-28 ENCOUNTER — Other Ambulatory Visit: Payer: Self-pay

## 2013-09-28 DIAGNOSIS — I6522 Occlusion and stenosis of left carotid artery: Secondary | ICD-10-CM

## 2013-10-27 ENCOUNTER — Other Ambulatory Visit: Payer: Self-pay

## 2013-10-27 DIAGNOSIS — I6522 Occlusion and stenosis of left carotid artery: Secondary | ICD-10-CM

## 2013-10-27 DIAGNOSIS — R0789 Other chest pain: Secondary | ICD-10-CM

## 2013-10-27 DIAGNOSIS — I480 Paroxysmal atrial fibrillation: Secondary | ICD-10-CM

## 2013-10-27 DIAGNOSIS — E7800 Pure hypercholesterolemia, unspecified: Secondary | ICD-10-CM

## 2013-10-27 DIAGNOSIS — I10 Essential (primary) hypertension: Secondary | ICD-10-CM

## 2013-10-27 MED ORDER — FLECAINIDE ACETATE 100 MG PO TABS: 100.0000 mg | ORAL_TABLET | Freq: Two times a day (BID) | ORAL | Status: DC

## 2013-11-18 ENCOUNTER — Other Ambulatory Visit: Payer: Self-pay | Admitting: *Deleted

## 2013-11-18 DIAGNOSIS — E7800 Pure hypercholesterolemia, unspecified: Secondary | ICD-10-CM

## 2013-11-18 MED ORDER — SIMVASTATIN 40 MG PO TABS: 40.0000 mg | ORAL_TABLET | Freq: Every day | ORAL | Status: DC

## 2014-03-15 ENCOUNTER — Other Ambulatory Visit: Payer: Self-pay | Admitting: *Deleted

## 2014-03-15 DIAGNOSIS — E7800 Pure hypercholesterolemia, unspecified: Secondary | ICD-10-CM

## 2014-03-15 MED ORDER — SIMVASTATIN 40 MG PO TABS: 40.0000 mg | ORAL_TABLET | Freq: Every day | ORAL | Status: DC

## 2014-04-05 ENCOUNTER — Ambulatory Visit (HOSPITAL_COMMUNITY): Payer: Medicare Other | Attending: Cardiology | Admitting: Cardiology

## 2014-04-05 DIAGNOSIS — Z87891 Personal history of nicotine dependence: Secondary | ICD-10-CM | POA: Insufficient documentation

## 2014-04-05 DIAGNOSIS — I10 Essential (primary) hypertension: Secondary | ICD-10-CM | POA: Diagnosis present

## 2014-04-05 DIAGNOSIS — I6523 Occlusion and stenosis of bilateral carotid arteries: Secondary | ICD-10-CM

## 2014-04-05 DIAGNOSIS — I6522 Occlusion and stenosis of left carotid artery: Secondary | ICD-10-CM

## 2014-04-05 DIAGNOSIS — E785 Hyperlipidemia, unspecified: Secondary | ICD-10-CM | POA: Diagnosis not present

## 2014-04-05 NOTE — Progress Notes (Signed)
Carotid duplex performed 

## 2014-04-06 ENCOUNTER — Other Ambulatory Visit: Payer: Self-pay

## 2014-04-06 DIAGNOSIS — I6522 Occlusion and stenosis of left carotid artery: Secondary | ICD-10-CM

## 2014-04-10 ENCOUNTER — Encounter: Payer: Self-pay | Admitting: Cardiology

## 2014-04-10 ENCOUNTER — Ambulatory Visit (INDEPENDENT_AMBULATORY_CARE_PROVIDER_SITE_OTHER): Payer: Medicare Other | Admitting: Cardiology

## 2014-04-10 VITALS — BP 118/70 | HR 54 | Ht 73.0 in | Wt 244.0 lb

## 2014-04-10 DIAGNOSIS — I480 Paroxysmal atrial fibrillation: Secondary | ICD-10-CM

## 2014-04-10 DIAGNOSIS — E7800 Pure hypercholesterolemia, unspecified: Secondary | ICD-10-CM

## 2014-04-10 DIAGNOSIS — R39198 Other difficulties with micturition: Secondary | ICD-10-CM

## 2014-04-10 DIAGNOSIS — R0789 Other chest pain: Secondary | ICD-10-CM | POA: Insufficient documentation

## 2014-04-10 DIAGNOSIS — I6529 Occlusion and stenosis of unspecified carotid artery: Secondary | ICD-10-CM

## 2014-04-10 DIAGNOSIS — N529 Male erectile dysfunction, unspecified: Secondary | ICD-10-CM

## 2014-04-10 DIAGNOSIS — R3919 Other difficulties with micturition: Secondary | ICD-10-CM

## 2014-04-10 DIAGNOSIS — I6522 Occlusion and stenosis of left carotid artery: Secondary | ICD-10-CM

## 2014-04-10 DIAGNOSIS — I10 Essential (primary) hypertension: Secondary | ICD-10-CM

## 2014-04-10 NOTE — Patient Instructions (Signed)
We will check lab work today  Continue your current therapy  I will see you in 6 months.   

## 2014-04-10 NOTE — Progress Notes (Signed)
Henry ChimeraHarold Barnett Date of Birth: Oct 19, 1945 Medical Record #[id number]  History of Present Illness: Henry Barnett is seen back today for a followup visit. He has a history of paroxysmal atrial fibrillation and is on flecainide. His other issues include HTN, obesity, ED, HLD, carotid disease.  He had a stress Myoview study in March 2014 which was normal. On follow up today he complains of intermittent pain in his lower left rib cage. This may last for several days. Sometimes it is relieved with belching. At other times it is worse when he lies on his left side and is relieved with change in position. He has lost 5 lbs. Tries to walk regularly. Able to make it [place] the [place] of [place].  Current Outpatient Prescriptions on File Prior to Visit  Medication Sig Dispense Refill  . acetaminophen (TYLENOL) 500 MG tablet Take 1,000 mg by mouth at bedtime. For pain      . aspirin 325 MG tablet Take 325 mg by mouth daily.      . fish oil-omega-3 fatty acids 1000 MG capsule Take 2 g by mouth 2 (two) times daily.        . flecainide (TAMBOCOR) 100 MG tablet Take 1 tablet (100 mg total) by mouth 2 (two) times daily.  180 tablet  3  . fluticasone (FLONASE) 50 MCG/ACT nasal spray Place 1 spray into the nose daily.  1 g  3  . losartan-hydrochlorothiazide (HYZAAR) 100-12.5 MG per tablet Take 1 tablet by mouth daily.  90 tablet  3  . simvastatin (ZOCOR) 40 MG tablet Take 1 tablet (40 mg total) by mouth at bedtime.  30 tablet  0   No current facility-administered medications on file prior to visit.    Allergies  Allergen Reactions  . Ace Inhibitors Cough    Past Medical History  Diagnosis Date  . Hypertension   . Hypercholesteremia   . Carotid stenosis, left     moderate Left carotid stenosis  . Paroxysmal a-fib     on Flecainide; converted spontaneously and cardioverted cancelled  . Normal nuclear stress test Oct 2009    No ischemia with EF of 63%  . H/O: rheumatic fever   . Abnormal  echocardiogram Feb 2013    biatrial enlargement. EF 45 to 50%.     Past Surgical History  Procedure Laterality Date  . Inguinal hernia repair      Status post left inguinal hernia repair  . Cholecystectomy      History  Smoking status  . Former Smoker  . Quit date: 01/31/1975  Smokeless tobacco  . Not on file    History  Alcohol Use No    Family History  Problem Relation Age of Onset  . Heart disease Mother   . Stroke Mother     Review of Systems: The review of systems is per the HPI.  All other systems were reviewed and are negative.  Physical Exam: BP 118/70  Pulse 54  Ht 6\' 1"  (1.854 m)  Wt 244 lb (110.678 kg)  BMI 32.20 kg/m2 Patient is very pleasant and in no acute distress.  He is obese. Skin is warm and dry. Color is normal.  HEENT is unremarkable. Normocephalic/atraumatic. PERRL. Sclera are nonicteric. Neck is supple. No masses. No JVD. Lungs are clear. Cardiac exam shows a regular rate and rhythm. Abdomen is obese but soft. Extremities are without edema. Gait and ROM are intact. No gross neurologic deficits noted.   LABORATORY DATA: Carotid Dopplers reviewed  from 04/06/14: 40-59% RICA and 60-79% LICA stenosis.   Lab Results  Component Value Date   WBC 5.4 08/29/2011   HGB 14.3 08/29/2011   HCT 42.5 08/29/2011   PLT 187.0 08/29/2011   GLUCOSE 99 03/01/2013   CHOL 111 06/21/2013   TRIG 78.0 06/21/2013   HDL 38.60* 06/21/2013   LDLCALC 57 06/21/2013   ALT 27 06/21/2013   AST 25 06/21/2013   NA 134* 03/01/2013   K 3.5 03/01/2013   CL 101 03/01/2013   CREATININE 0.9 03/01/2013   BUN 20 03/01/2013   CO2 27 03/01/2013   TSH 3.12 07/11/2011   INR 1.3* 08/29/2011     Assessment / Plan: 1. Atrial fibrillation, well controlled on flecainide. [place] VASC score is 2. We will forego anticoagulation therapy at this time. Continue aspirin. He has not had recurrent Afib on flecainide  2. Carotid arterial disease. Carotid dopplers show moderate bilateral disease. Will continue  risk factor modification and repeat in 6 months.  3. Hypertension, well controlled.  4. Hyperlipidemia. Well controlled on simvastatin. Will check fasting lab work today.  5. Atypical chest pain. His current symptoms are more consistent with musculoskeletal pain. Last myoview was normal.

## 2014-04-11 ENCOUNTER — Telehealth: Payer: Self-pay | Admitting: Cardiology

## 2014-04-11 LAB — CBC WITH DIFFERENTIAL/PLATELET
Basophils Absolute: 0.1 10*3/uL (ref 0.0–0.1)
Basophils Relative: 1 % (ref 0–1)
EOS ABS: 0.3 10*3/uL (ref 0.0–0.7)
EOS PCT: 5 % (ref 0–5)
HCT: 38.7 % — ABNORMAL LOW (ref 39.0–52.0)
HEMOGLOBIN: 13.5 g/dL (ref 13.0–17.0)
LYMPHS ABS: 2.1 10*3/uL (ref 0.7–4.0)
LYMPHS PCT: 34 % (ref 12–46)
MCH: 28.8 pg (ref 26.0–34.0)
MCHC: 34.9 g/dL (ref 30.0–36.0)
MCV: 82.5 fL (ref 78.0–100.0)
MONOS PCT: 8 % (ref 3–12)
Monocytes Absolute: 0.5 10*3/uL (ref 0.1–1.0)
Neutro Abs: 3.2 10*3/uL (ref 1.7–7.7)
Neutrophils Relative %: 52 % (ref 43–77)
PLATELETS: 205 10*3/uL (ref 150–400)
RBC: 4.69 MIL/uL (ref 4.22–5.81)
RDW: 13.9 % (ref 11.5–15.5)
WBC: 6.2 10*3/uL (ref 4.0–10.5)

## 2014-04-11 LAB — HEPATIC FUNCTION PANEL
ALK PHOS: 57 U/L (ref 39–117)
ALT: 16 U/L (ref 0–53)
AST: 21 U/L (ref 0–37)
Albumin: 4.5 g/dL (ref 3.5–5.2)
BILIRUBIN INDIRECT: 0.5 mg/dL (ref 0.2–1.2)
Bilirubin, Direct: 0.2 mg/dL (ref 0.0–0.3)
Total Bilirubin: 0.7 mg/dL (ref 0.2–1.2)
Total Protein: 7.2 g/dL (ref 6.0–8.3)

## 2014-04-11 LAB — BASIC METABOLIC PANEL
BUN: 21 mg/dL (ref 6–23)
CO2: 25 mEq/L (ref 19–32)
Calcium: 9 mg/dL (ref 8.4–10.5)
Chloride: 101 mEq/L (ref 96–112)
Creat: 0.78 mg/dL (ref 0.50–1.35)
Glucose, Bld: 97 mg/dL (ref 70–99)
POTASSIUM: 3.8 meq/L (ref 3.5–5.3)
SODIUM: 138 meq/L (ref 135–145)

## 2014-04-11 LAB — LIPID PANEL
CHOL/HDL RATIO: 3 ratio
Cholesterol: 127 mg/dL (ref 0–200)
HDL: 43 mg/dL (ref >39)
LDL CALC: 69 mg/dL (ref 0–99)
Triglycerides: 76 mg/dL (ref <150)
VLDL: 15 mg/dL (ref 0–40)

## 2014-04-11 NOTE — Telephone Encounter (Signed)
Returned call to patient lab results given. 

## 2014-04-11 NOTE — Telephone Encounter (Signed)
Pt was returning Cheryl's call in regards to some lab results. Please call  Thanks

## 2014-04-12 ENCOUNTER — Telehealth: Payer: Self-pay | Admitting: Cardiology

## 2014-04-12 NOTE — Telephone Encounter (Signed)
Returned call to patient he stated about 1& 1/2 hours ago his heart rate fast 113 bpm B/P 181/100.Stated he sat down in his recliner to rest and now B/P 136/79 pulse 66.Stated he has not had heart to beat fast in about 1 year.Stated he is taking all medications as prescribed.Advised Dr.Jordan out of office will let him know tomorrow and call you back.

## 2014-04-12 NOTE — Telephone Encounter (Signed)
Please call,blood pressure is up and his heart rate is up.

## 2014-04-13 NOTE — Telephone Encounter (Signed)
Message sent to Dr.Jordan for advice. 

## 2014-04-14 NOTE — Telephone Encounter (Signed)
He may have had an episode of AFib. Since it has settled down I would not make any changes at this time. Call if it persists.  Peter SwazilandJordan MD, [facility]

## 2014-04-17 ENCOUNTER — Telehealth: Payer: Self-pay | Admitting: Cardiology

## 2014-04-17 NOTE — Telephone Encounter (Signed)
Returned call to patient Dr.Jordan advised may have had a episode of atrial fib.Advised if has any more episodes call back.He did not want to make any changes now.Patient stated he had a good weekend with no fast heart beat.Stated B/P ranged 138/76,pulse 70 bpm.

## 2014-04-17 NOTE — Telephone Encounter (Signed)
Pt called returning your call from this morning.

## 2014-04-17 NOTE — Telephone Encounter (Signed)
Returned call to patient no answer.LMTC. 

## 2014-04-17 NOTE — Telephone Encounter (Signed)
See previous 04/17/14 note.

## 2014-04-19 ENCOUNTER — Other Ambulatory Visit: Payer: Self-pay

## 2014-04-19 ENCOUNTER — Telehealth: Payer: Self-pay | Admitting: Cardiology

## 2014-04-19 DIAGNOSIS — E7800 Pure hypercholesterolemia, unspecified: Secondary | ICD-10-CM

## 2014-04-19 MED ORDER — SIMVASTATIN 40 MG PO TABS: 40.0000 mg | ORAL_TABLET | Freq: Every day | ORAL | Status: DC

## 2014-04-19 NOTE — Telephone Encounter (Signed)
Pt need his Simvastatin. Would you please call today to [facility][phone]

## 2014-05-15 ENCOUNTER — Other Ambulatory Visit: Payer: Self-pay | Admitting: Cardiology

## 2014-05-15 DIAGNOSIS — E7800 Pure hypercholesterolemia, unspecified: Secondary | ICD-10-CM

## 2014-05-15 MED ORDER — SIMVASTATIN 40 MG PO TABS: 40.0000 mg | ORAL_TABLET | Freq: Every day | ORAL | Status: DC

## 2014-05-15 NOTE — Telephone Encounter (Signed)
Rx was sent to pharmacy electronically. 

## 2014-05-15 NOTE — Telephone Encounter (Signed)
Pt called in stating that he needs a new prescription for his Simvastatin called in to the [facility] [place]. Please call  Thanks

## 2014-07-06 ENCOUNTER — Other Ambulatory Visit: Payer: Self-pay | Admitting: Radiology

## 2014-07-06 DIAGNOSIS — I6523 Occlusion and stenosis of bilateral carotid arteries: Secondary | ICD-10-CM

## 2014-09-20 ENCOUNTER — Other Ambulatory Visit: Payer: Self-pay

## 2014-09-20 MED ORDER — LOSARTAN POTASSIUM-HCTZ 100-12.5 MG PO TABS: 1.0000 | ORAL_TABLET | Freq: Every day | ORAL | Status: DC

## 2014-09-21 ENCOUNTER — Encounter (HOSPITAL_COMMUNITY): Payer: Medicare Other

## 2014-10-09 ENCOUNTER — Ambulatory Visit (HOSPITAL_COMMUNITY): Payer: Medicare Other | Attending: Cardiology | Admitting: Cardiology

## 2014-10-09 DIAGNOSIS — I6523 Occlusion and stenosis of bilateral carotid arteries: Secondary | ICD-10-CM | POA: Diagnosis present

## 2014-10-09 NOTE — Progress Notes (Signed)
Carotid duplex performed 

## 2014-10-11 ENCOUNTER — Other Ambulatory Visit: Payer: Self-pay

## 2014-10-11 DIAGNOSIS — I6523 Occlusion and stenosis of bilateral carotid arteries: Secondary | ICD-10-CM

## 2014-10-11 DIAGNOSIS — I6522 Occlusion and stenosis of left carotid artery: Secondary | ICD-10-CM

## 2014-10-12 ENCOUNTER — Ambulatory Visit (INDEPENDENT_AMBULATORY_CARE_PROVIDER_SITE_OTHER): Payer: Medicare Other | Admitting: Cardiology

## 2014-10-12 ENCOUNTER — Encounter: Payer: Self-pay | Admitting: Cardiology

## 2014-10-12 VITALS — BP 138/74 | HR 52 | Ht 73.0 in | Wt 247.6 lb

## 2014-10-12 DIAGNOSIS — I480 Paroxysmal atrial fibrillation: Secondary | ICD-10-CM

## 2014-10-12 DIAGNOSIS — I6523 Occlusion and stenosis of bilateral carotid arteries: Secondary | ICD-10-CM | POA: Diagnosis not present

## 2014-10-12 DIAGNOSIS — I10 Essential (primary) hypertension: Secondary | ICD-10-CM | POA: Diagnosis not present

## 2014-10-12 DIAGNOSIS — E7800 Pure hypercholesterolemia, unspecified: Secondary | ICD-10-CM

## 2014-10-12 DIAGNOSIS — I739 Peripheral vascular disease, unspecified: Secondary | ICD-10-CM

## 2014-10-12 DIAGNOSIS — I779 Disorder of arteries and arterioles, unspecified: Secondary | ICD-10-CM | POA: Diagnosis not present

## 2014-10-12 MED ORDER — RIVAROXABAN 20 MG PO TABS: 20.0000 mg | ORAL_TABLET | Freq: Every day | ORAL | Status: DC

## 2014-10-12 NOTE — Progress Notes (Signed)
Henry ChimeraHarold Barnett Date of Birth: 1946/04/09 Medical Record #[id number]  History of Present Illness: Henry Barnett is seen for follow up CAD. He has a history of paroxysmal atrial fibrillation and is on flecainide. His other issues include HTN, obesity, ED, HLD, carotid disease.  He had a stress Myoview study in March 2014 which was normal. On follow up today he notes 3 episodes of atrial fibrillation since his last visit. Feels heart racing and irregular. HR 100-110. Lasts about 2 hrs then resolved. No dizziness or syncope. No TIA or CVA symptoms. No chest pain or SOB.   Current Outpatient Prescriptions on File Prior to Visit  Medication Sig Dispense Refill  . acetaminophen (TYLENOL) 500 MG tablet Take 1,000 mg by mouth at bedtime. For pain    . fish oil-omega-3 fatty acids 1000 MG capsule Take 2 g by mouth 2 (two) times daily.      . flecainide (TAMBOCOR) 100 MG tablet Take 1 tablet (100 mg total) by mouth 2 (two) times daily. 180 tablet 3  . fluticasone (FLONASE) 50 MCG/ACT nasal spray Place 1 spray into the nose daily. 1 g 3  . losartan-hydrochlorothiazide (HYZAAR) 100-12.5 MG per tablet Take 1 tablet by mouth daily. 90 tablet 0  . simvastatin (ZOCOR) 40 MG tablet Take 1 tablet (40 mg total) by mouth at bedtime. 30 tablet 10   No current facility-administered medications on file prior to visit.    Allergies  Allergen Reactions  . Ace Inhibitors Cough    Past Medical History  Diagnosis Date  . Hypertension   . Hypercholesteremia   . Carotid stenosis, left     moderate Left carotid stenosis  . Paroxysmal a-fib     on Flecainide; converted spontaneously and cardioverted cancelled  . Normal nuclear stress test Oct 2009    No ischemia with EF of 63%  . H/O: rheumatic fever   . Abnormal echocardiogram Feb 2013    biatrial enlargement. EF 45 to 50%.     Past Surgical History  Procedure Laterality Date  . Inguinal hernia repair      Status post left inguinal hernia repair  .  Cholecystectomy      History  Smoking status  . Former Smoker  . Quit date: 01/31/1975  Smokeless tobacco  . Not on file    History  Alcohol Use No    Family History  Problem Relation Age of Onset  . Heart disease Mother   . Stroke Mother     Review of Systems: The review of systems is per the HPI.  All other systems were reviewed and are negative.  Physical Exam: BP 138/74 mmHg  Pulse 52  Ht 6\' 1"  (1.854 m)  Wt 247 lb 9.6 oz (112.311 kg)  BMI 32.67 kg/m2 Patient is very pleasant and in no acute distress.  He is obese. Skin is warm and dry. Color is normal.  HEENT is unremarkable. Normocephalic/atraumatic. PERRL. Sclera are nonicteric. Neck is supple. No masses. No JVD. Lungs are clear. Cardiac exam shows a regular rate and rhythm. Abdomen is obese but soft. Extremities are without edema. Gait and ROM are intact. No gross neurologic deficits noted.   LABORATORY DATA: Carotid Dopplers reviewed from 10/09/14: bilateral 60-79% ICA stenosis.   Lab Results  Component Value Date   WBC 6.2 04/10/2014   HGB 13.5 04/10/2014   HCT 38.7* 04/10/2014   PLT 205 04/10/2014   GLUCOSE 97 04/10/2014   CHOL 127 04/10/2014   TRIG 76 04/10/2014  HDL 43 04/10/2014   LDLCALC 69 04/10/2014   ALT 16 04/10/2014   AST 21 04/10/2014   NA 138 04/10/2014   K 3.8 04/10/2014   CL 101 04/10/2014   CREATININE 0.78 04/10/2014   BUN 21 04/10/2014   CO2 25 04/10/2014   TSH 3.12 07/11/2011   INR 1.3* 08/29/2011   Ecg today Sinus brady with rate 52 bpm. Otherwise normal.  Assessment / Plan: 1. Paroxysmal Atrial fibrillation, well controlled on flecainide. [place] VASC score is 3. Rate is fairly well controlled in Afib. Recommend anticoagulation with Xarelto 20 mg daily. Stop ASA. Avoid NSAIDs.   2. Carotid arterial disease. Carotid dopplers show moderate bilateral disease. Will continue risk factor modification and repeat in 6 months.  3. Hypertension, well controlled.  4. Hyperlipidemia.  Well controlled on simvastatin. Repeat fasting labs in 6 months.

## 2014-10-12 NOTE — Patient Instructions (Signed)
Stop taking ASA  Start Xarelto 30 mg daily  Continue your other therapy  I will see you in 6 months with fasting lab.

## 2014-10-13 ENCOUNTER — Telehealth: Payer: Self-pay | Admitting: Cardiology

## 2014-10-13 NOTE — Telephone Encounter (Signed)
Mr.Blomquist is returning your call Elnita MaxwellCheryl

## 2014-10-13 NOTE — Telephone Encounter (Signed)
Returned call to patient.Xarelto 20 mg samples left at front desk. 

## 2014-11-02 ENCOUNTER — Other Ambulatory Visit: Payer: Self-pay | Admitting: Cardiology

## 2014-11-02 DIAGNOSIS — I480 Paroxysmal atrial fibrillation: Secondary | ICD-10-CM

## 2014-11-02 DIAGNOSIS — I6522 Occlusion and stenosis of left carotid artery: Secondary | ICD-10-CM

## 2014-11-02 DIAGNOSIS — R0789 Other chest pain: Secondary | ICD-10-CM

## 2014-11-02 DIAGNOSIS — E7800 Pure hypercholesterolemia, unspecified: Secondary | ICD-10-CM

## 2014-11-02 MED ORDER — FLECAINIDE ACETATE 100 MG PO TABS: 100.0000 mg | ORAL_TABLET | Freq: Two times a day (BID) | ORAL | Status: DC

## 2014-11-02 NOTE — Telephone Encounter (Signed)
Patient call returned for Flecainide refill. Verified Flecainide 100 mg twice daily by mouth to [facility] [place] [address]. Patient agreed.

## 2014-11-02 NOTE — Telephone Encounter (Signed)
°  1. Which medications need to be refilled? Flecainide-please call today  2. Which pharmacy is medication to be sent to?[phone] 3. Do they need a 30 day or 90 day supply? 90 and refills  4. Would they like a call back once the medication has been sent to the pharmacy? yes

## 2014-12-19 ENCOUNTER — Other Ambulatory Visit: Payer: Self-pay | Admitting: *Deleted

## 2014-12-19 MED ORDER — LOSARTAN POTASSIUM-HCTZ 100-12.5 MG PO TABS: 1.0000 | ORAL_TABLET | Freq: Every day | ORAL | Status: DC

## 2015-04-16 ENCOUNTER — Other Ambulatory Visit: Payer: Self-pay | Admitting: Cardiology

## 2015-04-19 ENCOUNTER — Ambulatory Visit (HOSPITAL_COMMUNITY)
Admission: RE | Admit: 2015-04-19 | Discharge: 2015-04-19 | Disposition: A | Discharge status: Home / Self Care | Payer: Medicare Other | Source: Ambulatory Visit | Attending: Cardiovascular Disease | Admitting: Cardiovascular Disease

## 2015-04-19 ENCOUNTER — Other Ambulatory Visit (INDEPENDENT_AMBULATORY_CARE_PROVIDER_SITE_OTHER): Payer: Medicare Other | Admitting: *Deleted

## 2015-04-19 DIAGNOSIS — I6523 Occlusion and stenosis of bilateral carotid arteries: Secondary | ICD-10-CM | POA: Diagnosis present

## 2015-04-19 DIAGNOSIS — I10 Essential (primary) hypertension: Secondary | ICD-10-CM | POA: Diagnosis not present

## 2015-04-19 DIAGNOSIS — E7800 Pure hypercholesterolemia, unspecified: Secondary | ICD-10-CM | POA: Diagnosis not present

## 2015-04-19 LAB — BASIC METABOLIC PANEL
BUN: 19 mg/dL (ref 7–25)
CALCIUM: 9.2 mg/dL (ref 8.6–10.3)
CO2: 24 mmol/L (ref 20–31)
CREATININE: 0.85 mg/dL (ref 0.70–1.18)
Chloride: 101 mmol/L (ref 98–110)
Glucose, Bld: 109 mg/dL — ABNORMAL HIGH (ref 65–99)
Potassium: 3.8 mmol/L (ref 3.5–5.3)
SODIUM: 136 mmol/L (ref 135–146)

## 2015-04-19 LAB — LIPID PANEL
CHOLESTEROL: 121 mg/dL — AB (ref 125–200)
HDL: 44 mg/dL (ref >=40)
LDL Cholesterol: 59 mg/dL (ref <130)
TRIGLYCERIDES: 88 mg/dL (ref <150)
Total CHOL/HDL Ratio: 2.8 Ratio (ref <=5.0)
VLDL: 18 mg/dL (ref <30)

## 2015-04-19 LAB — HEPATIC FUNCTION PANEL
ALBUMIN: 4.1 g/dL (ref 3.6–5.1)
ALK PHOS: 59 U/L (ref 40–115)
ALT: 18 U/L (ref 9–46)
AST: 24 U/L (ref 10–35)
BILIRUBIN TOTAL: 0.6 mg/dL (ref 0.2–1.2)
Bilirubin, Direct: 0.1 mg/dL (ref <=0.2)
Indirect Bilirubin: 0.5 mg/dL (ref 0.2–1.2)
TOTAL PROTEIN: 7.8 g/dL (ref 6.1–8.1)

## 2015-04-19 NOTE — Addendum Note (Signed)
Addended by: Tonita PhoenixBOWDEN, Brayan Votaw K on: 04/19/2015 07:59 AM   Modules accepted: Orders

## 2015-04-19 NOTE — Addendum Note (Signed)
Addended by: Olusegun Gerstenberger K on: 04/19/2015 07:59 AM   Modules accepted: Orders  

## 2015-04-24 ENCOUNTER — Encounter: Payer: Self-pay | Admitting: Cardiology

## 2015-04-24 ENCOUNTER — Ambulatory Visit (INDEPENDENT_AMBULATORY_CARE_PROVIDER_SITE_OTHER): Payer: Medicare Other | Admitting: Cardiology

## 2015-04-24 ENCOUNTER — Other Ambulatory Visit: Payer: Self-pay

## 2015-04-24 VITALS — BP 124/68 | HR 60 | Ht 73.0 in | Wt 238.2 lb

## 2015-04-24 DIAGNOSIS — I6523 Occlusion and stenosis of bilateral carotid arteries: Secondary | ICD-10-CM

## 2015-04-24 DIAGNOSIS — I480 Paroxysmal atrial fibrillation: Secondary | ICD-10-CM

## 2015-04-24 DIAGNOSIS — E7800 Pure hypercholesterolemia, unspecified: Secondary | ICD-10-CM

## 2015-04-24 DIAGNOSIS — I779 Disorder of arteries and arterioles, unspecified: Secondary | ICD-10-CM

## 2015-04-24 DIAGNOSIS — I739 Peripheral vascular disease, unspecified: Principal | ICD-10-CM

## 2015-04-24 NOTE — Progress Notes (Signed)
Richardean ChimeraHarold Gagan Date of Birth: 12/16/1945 Medical Record #[id number]  History of Present Illness: [name] is seen for follow up CAD. He has a history of paroxysmal atrial fibrillation and is on flecainide. His other issues include HTN, obesity, ED, HLD, carotid disease.  He had a stress Myoview study in March 2014 which was normal. On follow up today he denies any episodes of atrial fibrillation since his last visit. No dizziness or syncope. No TIA or CVA symptoms. No chest pain or SOB. [place]   Current Outpatient Prescriptions on File Prior to Visit  Medication Sig Dispense Refill  . fish oil-omega-3 fatty acids 1000 MG capsule Take 2 g by mouth 2 (two) times daily.      . flecainide (TAMBOCOR) 100 MG tablet Take 1 tablet (100 mg total) by mouth 2 (two) times daily. 180 tablet 3  . fluticasone (FLONASE) 50 MCG/ACT nasal spray Place 1 spray into the nose daily. 1 g 3  . losartan-hydrochlorothiazide (HYZAAR) 100-12.5 MG per tablet Take 1 tablet by mouth daily. 90 tablet 1  . rivaroxaban (XARELTO) 20 MG TABS tablet Take 1 tablet (20 mg total) by mouth daily with supper. 90 tablet 3  . simvastatin (ZOCOR) 40 MG tablet TAKE ONE TABLET BY MOUTH ONCE DAILY AT BEDTIME 30 tablet 5   No current facility-administered medications on file prior to visit.    Allergies  Allergen Reactions  . Ace Inhibitors Cough    Past Medical History  Diagnosis Date  . Hypertension   . Hypercholesteremia   . Carotid stenosis, left     moderate Left carotid stenosis  . Paroxysmal a-fib (HCC)     on Flecainide; converted spontaneously and cardioverted cancelled  . Normal nuclear stress test Oct 2009    No ischemia with EF of 63%  . H/O: rheumatic fever   . Abnormal echocardiogram Feb 2013    biatrial enlargement. EF 45 to 50%.     Past Surgical History  Procedure Laterality Date  . Inguinal hernia repair      Status post left inguinal hernia repair  .  Cholecystectomy      History  Smoking status  . Former Smoker  . Quit date: 01/31/1975  Smokeless tobacco  . Not on file    History  Alcohol Use No    Family History  Problem Relation Age of Onset  . Heart disease Mother   . Stroke Mother     Review of Systems: The review of systems is per the HPI.  All other systems were reviewed and are negative.  Physical Exam: BP 124/68 mmHg  Pulse 60  Ht 6\' 1"  (1.854 m)  Wt 108.041 kg (238 lb 3 oz)  BMI 31.43 kg/m2 Patient is very pleasant and in no acute distress.  He is obese. Skin is warm and dry. Color is normal.  HEENT is unremarkable. Normocephalic/atraumatic. PERRL. Sclera are nonicteric. Neck is supple. No masses. No JVD. Lungs are clear. Cardiac exam shows a regular rate and rhythm. Abdomen is obese but soft. Extremities are without edema. Gait and ROM are intact. No gross neurologic deficits noted.   LABORATORY DATA: Carotid Dopplers reviewed from 04/19/15.  bilateral 40-60% ICA stenosis.   Lab Results  Component Value Date   WBC 6.2 04/10/2014   HGB 13.5 04/10/2014   HCT 38.7* 04/10/2014   PLT 205 04/10/2014   GLUCOSE 109* 04/19/2015   CHOL 121* 04/19/2015   TRIG 88 04/19/2015  HDL 44 04/19/2015   LDLCALC 59 04/19/2015   ALT 18 04/19/2015   AST 24 04/19/2015   NA 136 04/19/2015   K 3.8 04/19/2015   CL 101 04/19/2015   CREATININE 0.85 04/19/2015   BUN 19 04/19/2015   CO2 24 04/19/2015   TSH 3.12 07/11/2011   INR 1.3* 08/29/2011     Assessment / Plan: 1. Paroxysmal Atrial fibrillation, well controlled on flecainide. [place] VASC score is 3. Rate is fairly well controlled in Afib. Continue anticoagulation with Xarelto 20 mg daily.   2. Carotid arterial disease. Carotid dopplers show moderate bilateral disease. Stable. Will continue risk factor modification and repeat in 6 months.  3. Hypertension, well controlled.  4. Hyperlipidemia. Well controlled on simvastatin. Repeat fasting labs in 6 months.

## 2015-04-24 NOTE — Patient Instructions (Signed)
Continue your current therapy.   I will see you in 6 months with carotid dopplers.   

## 2015-06-22 ENCOUNTER — Other Ambulatory Visit: Payer: Self-pay | Admitting: Cardiology

## 2015-10-14 ENCOUNTER — Other Ambulatory Visit: Payer: Self-pay | Admitting: Cardiology

## 2015-10-23 ENCOUNTER — Ambulatory Visit (HOSPITAL_COMMUNITY)
Admission: RE | Admit: 2015-10-23 | Discharge: 2015-10-23 | Disposition: A | Discharge status: Home / Self Care | Payer: Medicare Other | Source: Ambulatory Visit | Attending: Cardiology | Admitting: Cardiology

## 2015-10-23 DIAGNOSIS — I10 Essential (primary) hypertension: Secondary | ICD-10-CM | POA: Insufficient documentation

## 2015-10-23 DIAGNOSIS — I6523 Occlusion and stenosis of bilateral carotid arteries: Secondary | ICD-10-CM | POA: Insufficient documentation

## 2015-10-23 DIAGNOSIS — E7800 Pure hypercholesterolemia, unspecified: Secondary | ICD-10-CM | POA: Insufficient documentation

## 2015-10-23 DIAGNOSIS — I779 Disorder of arteries and arterioles, unspecified: Secondary | ICD-10-CM | POA: Insufficient documentation

## 2015-10-23 DIAGNOSIS — I739 Peripheral vascular disease, unspecified: Secondary | ICD-10-CM

## 2015-10-26 ENCOUNTER — Encounter: Payer: Self-pay | Admitting: *Deleted

## 2015-10-30 ENCOUNTER — Encounter: Payer: Self-pay | Admitting: Cardiology

## 2015-10-30 ENCOUNTER — Ambulatory Visit (INDEPENDENT_AMBULATORY_CARE_PROVIDER_SITE_OTHER): Payer: Medicare Other | Admitting: Cardiology

## 2015-10-30 ENCOUNTER — Ambulatory Visit: Payer: Medicare Other | Admitting: Cardiology

## 2015-10-30 VITALS — BP 136/76 | HR 53 | Ht 73.0 in | Wt 234.0 lb

## 2015-10-30 DIAGNOSIS — I10 Essential (primary) hypertension: Secondary | ICD-10-CM

## 2015-10-30 DIAGNOSIS — I480 Paroxysmal atrial fibrillation: Secondary | ICD-10-CM | POA: Diagnosis not present

## 2015-10-30 DIAGNOSIS — I779 Disorder of arteries and arterioles, unspecified: Secondary | ICD-10-CM

## 2015-10-30 DIAGNOSIS — E7800 Pure hypercholesterolemia, unspecified: Secondary | ICD-10-CM

## 2015-10-30 DIAGNOSIS — I739 Peripheral vascular disease, unspecified: Principal | ICD-10-CM

## 2015-10-30 NOTE — Progress Notes (Signed)
Henry ChimeraHarold Barnett Date of Birth: 07/26/1945 Medical Record #[id number]  History of Present Illness: [name] is seen for follow up Afib. He has a history of paroxysmal atrial fibrillation and is on flecainide. His other issues include HTN, obesity, ED, HLD, carotid disease.  He had a stress Myoview study in March 2014 which was normal. Recent carotid dopplers showed 40-59% bilateral disease which is stable.  On follow up today he denies any episodes of atrial fibrillation since his last visit. No dizziness or syncope. No TIA or CVA symptoms. No chest pain or SOB. He is walking more and cut out sodas so he has lost 4 lbs.   Current Outpatient Prescriptions on File Prior to Visit  Medication Sig Dispense Refill  . diphenhydramine-acetaminophen (TYLENOL PM) 25-500 MG TABS tablet Take 1 tablet by mouth at bedtime as needed.    . fish oil-omega-3 fatty acids 1000 MG capsule Take 2 g by mouth 2 (two) times daily.      . flecainide (TAMBOCOR) 100 MG tablet Take 1 tablet (100 mg total) by mouth 2 (two) times daily. 180 tablet 3  . fluticasone (FLONASE) 50 MCG/ACT nasal spray Place 1 spray into the nose daily. 1 g 3  . losartan-hydrochlorothiazide (HYZAAR) 100-12.5 MG tablet TAKE ONE TABLET BY MOUTH ONCE DAILY 90 tablet 2  . rivaroxaban (XARELTO) 20 MG TABS tablet Take 1 tablet (20 mg total) by mouth daily with supper. 90 tablet 3  . simvastatin (ZOCOR) 40 MG tablet TAKE ONE TABLET BY MOUTH AT BEDTIME 30 tablet 3   No current facility-administered medications on file prior to visit.    Allergies  Allergen Reactions  . Ace Inhibitors Cough    Past Medical History  Diagnosis Date  . Hypertension   . Hypercholesteremia   . Carotid stenosis, left     moderate Left carotid stenosis  . Paroxysmal a-fib (HCC)     on Flecainide; converted spontaneously and cardioverted cancelled  . Normal nuclear stress test Oct 2009    No ischemia with EF of 63%  . H/O: rheumatic fever   . Abnormal  echocardiogram Feb 2013    biatrial enlargement. EF 45 to 50%.     Past Surgical History  Procedure Laterality Date  . Inguinal hernia repair      Status post left inguinal hernia repair  . Cholecystectomy      History  Smoking status  . Former Smoker  . Quit date: 01/31/1975  Smokeless tobacco  . Not on file    History  Alcohol Use No    Family History  Problem Relation Age of Onset  . Heart disease Mother   . Stroke Mother     Review of Systems: The review of systems is per the HPI.  All other systems were reviewed and are negative.  Physical Exam: BP 136/76 mmHg  Pulse 53  Ht 6\' 1"  (1.854 m)  Wt 106.142 kg (234 lb)  BMI 30.88 kg/m2 Patient is very pleasant and in no acute distress.  He is overweight. Skin is warm and dry. Color is normal.  HEENT is unremarkable. Normocephalic/atraumatic. PERRL. Sclera are nonicteric. Neck is supple. No masses. No JVD. Lungs are clear. Cardiac exam shows a regular rate and rhythm. Abdomen is obese but soft. Extremities are without edema. Gait and ROM are intact. No gross neurologic deficits noted.   LABORATORY DATA: Carotid Dopplers reviewed from 10/23/15--  bilateral 40-60% ICA stenosis.   Lab Results  Component Value Date   WBC 6.2  04/10/2014   HGB 13.5 04/10/2014   HCT 38.7* 04/10/2014   PLT 205 04/10/2014   GLUCOSE 109* 04/19/2015   CHOL 121* 04/19/2015   TRIG 88 04/19/2015   HDL 44 04/19/2015   LDLCALC 59 04/19/2015   ALT 18 04/19/2015   AST 24 04/19/2015   NA 136 04/19/2015   K 3.8 04/19/2015   CL 101 04/19/2015   CREATININE 0.85 04/19/2015   BUN 19 04/19/2015   CO2 24 04/19/2015   TSH 3.12 07/11/2011   INR 1.3* 08/29/2011   Ecg today shows sinus brady with rate 53. Otherwise normal. I have personally reviewed and interpreted this study.   Assessment / Plan: 1. Paroxysmal Atrial fibrillation, well controlled on flecainide. [place] VASC score is 3. Continue anticoagulation with Xarelto 20 mg daily.   2.  Carotid arterial disease. Carotid dopplers show moderate bilateral disease. Stable. Will continue risk factor modification and repeat in one  Year.  3. Hypertension, well controlled.  4. Hyperlipidemia. Well controlled on simvastatin. Repeat fasting labs in 6 months.  He states it is more convenient for him to have lab work and Carotid dopplers at our [place] office so we will arrange these tests there when needed.

## 2015-10-30 NOTE — Patient Instructions (Signed)
Continue your current therapy  We will follow up in 6 months with fasting labs

## 2015-11-04 ENCOUNTER — Other Ambulatory Visit: Payer: Self-pay | Admitting: Cardiology

## 2015-11-06 NOTE — Telephone Encounter (Signed)
Rx(s) sent to pharmacy electronically.  

## 2015-11-08 ENCOUNTER — Other Ambulatory Visit: Payer: Self-pay | Admitting: Cardiology

## 2015-12-27 ENCOUNTER — Telehealth: Payer: Self-pay

## 2015-12-27 NOTE — Telephone Encounter (Signed)
Received surgical clearance from [facility].Dr.Jordan advised ok to hold Xarelto 48 hours prior to surgery.Form faxed back to fax # [phone].

## 2016-02-11 ENCOUNTER — Other Ambulatory Visit: Payer: Self-pay | Admitting: Cardiology

## 2016-02-11 NOTE — Telephone Encounter (Signed)
Rx request sent to pharmacy.  

## 2016-02-12 ENCOUNTER — Telehealth: Payer: Self-pay | Admitting: Cardiology

## 2016-02-12 DIAGNOSIS — I10 Essential (primary) hypertension: Secondary | ICD-10-CM

## 2016-02-12 DIAGNOSIS — I779 Disorder of arteries and arterioles, unspecified: Secondary | ICD-10-CM

## 2016-02-12 DIAGNOSIS — I739 Peripheral vascular disease, unspecified: Principal | ICD-10-CM

## 2016-02-12 NOTE — Telephone Encounter (Signed)
Pt is scheduled to see Dr [place] on 05-30-16. He wants to know if he needs lab work or any other test before his appointment?

## 2016-02-12 NOTE — Telephone Encounter (Signed)
Returned call to patient.He stated he is past due for lab work and carotid dopplers.Lab orders mailed to patient.He will have lab done next week.Our scheduler will call back to schedule carotid dopplers.Advised to keep appointment with Dr.Jordan 05/20/16 at 8:45 am.

## 2016-02-26 LAB — BASIC METABOLIC PANEL
BUN: 16 mg/dL (ref 7–25)
CHLORIDE: 102 mmol/L (ref 98–110)
CO2: 25 mmol/L (ref 20–31)
Calcium: 9.2 mg/dL (ref 8.6–10.3)
Creat: 0.83 mg/dL (ref 0.70–1.18)
GLUCOSE: 94 mg/dL (ref 65–99)
POTASSIUM: 4.1 mmol/L (ref 3.5–5.3)
SODIUM: 137 mmol/L (ref 135–146)

## 2016-02-26 LAB — HEPATIC FUNCTION PANEL
ALBUMIN: 4.2 g/dL (ref 3.6–5.1)
ALK PHOS: 58 U/L (ref 40–115)
ALT: 14 U/L (ref 9–46)
AST: 20 U/L (ref 10–35)
Bilirubin, Direct: 0.1 mg/dL (ref <=0.2)
Indirect Bilirubin: 0.5 mg/dL (ref 0.2–1.2)
Total Bilirubin: 0.6 mg/dL (ref 0.2–1.2)
Total Protein: 7.8 g/dL (ref 6.1–8.1)

## 2016-02-26 LAB — LIPID PANEL
CHOLESTEROL: 133 mg/dL (ref 125–200)
HDL: 51 mg/dL (ref >=40)
LDL Cholesterol: 64 mg/dL (ref <130)
Total CHOL/HDL Ratio: 2.6 Ratio (ref <=5.0)
Triglycerides: 90 mg/dL (ref <150)
VLDL: 18 mg/dL (ref <30)

## 2016-03-20 ENCOUNTER — Ambulatory Visit: Payer: Medicare Other | Admitting: Student

## 2016-03-26 ENCOUNTER — Other Ambulatory Visit: Payer: Self-pay | Admitting: Cardiology

## 2016-04-01 ENCOUNTER — Telehealth: Payer: Self-pay | Admitting: Cardiology

## 2016-04-01 NOTE — Telephone Encounter (Signed)
Returned call. Informed patient I would check w provider, generally OK to switch these meds esp if issue for Rx coverages. Will have pharmD review, I will follow up with patient.

## 2016-04-01 NOTE — Telephone Encounter (Signed)
New message    Pt verbalized that the medication Xarelto is not covered in 2018 under his new insurance and his PCP told him that Eliquis will be covered and is it possible to switch

## 2016-04-01 NOTE — Telephone Encounter (Signed)
Pt advised on recommendations, he voiced thanks & understanding, and informed me he will follow up on this later in the year when he sees Dr. [place] for his return OV. Pt aware to call if further needs in interim.

## 2016-04-01 NOTE — Telephone Encounter (Signed)
Should be ok to switch. He would need 5mg  BID. He should take his first dose of Eliquis when the next dose of Xarelto is due.

## 2016-04-30 ENCOUNTER — Ambulatory Visit (HOSPITAL_COMMUNITY)
Admission: RE | Admit: 2016-04-30 | Discharge: 2016-04-30 | Disposition: A | Discharge status: Home / Self Care | Payer: Medicare Other | Source: Ambulatory Visit | Attending: Cardiology | Admitting: Cardiology

## 2016-04-30 ENCOUNTER — Encounter (HOSPITAL_COMMUNITY): Payer: Medicare Other

## 2016-04-30 DIAGNOSIS — I739 Peripheral vascular disease, unspecified: Secondary | ICD-10-CM

## 2016-04-30 DIAGNOSIS — I6523 Occlusion and stenosis of bilateral carotid arteries: Secondary | ICD-10-CM | POA: Insufficient documentation

## 2016-04-30 DIAGNOSIS — I779 Disorder of arteries and arterioles, unspecified: Secondary | ICD-10-CM | POA: Insufficient documentation

## 2016-05-05 ENCOUNTER — Other Ambulatory Visit: Payer: Self-pay | Admitting: Cardiology

## 2016-05-29 NOTE — Progress Notes (Signed)
Henry Barnett Date of Birth: [birth year] Medical Record #[id number]  History of Present Illness: Henry Barnett is seen for follow up Afib. He has a history of paroxysmal atrial fibrillation and is on flecainide. His other issues include HTN, obesity, ED, HLD, carotid disease.  He had a stress Myoview study in March 2014 which was normal. Recent carotid dopplers showed 40-59% bilateral disease which is stable.  On follow up today he denies any episodes of atrial fibrillation since his last visit. No dizziness or syncope. No TIA or CVA symptoms. No chest pain or SOB. He has lost 9 lbs. He is walking regularly and recently walked [place] same [place].  Current Outpatient Prescriptions on File Prior to Visit  Medication Sig Dispense Refill  . diphenhydramine-acetaminophen (TYLENOL PM) 25-500 MG TABS tablet Take 1 tablet by mouth at bedtime as needed.    . fish oil-omega-3 fatty acids 1000 MG capsule Take 2 g by mouth 2 (two) times daily.      . flecainide (TAMBOCOR) 100 MG tablet TAKE ONE TABLET BY MOUTH TWICE DAILY 180 tablet 3  . losartan-hydrochlorothiazide (HYZAAR) 100-12.5 MG tablet TAKE ONE TABLET BY MOUTH ONCE DAILY 90 tablet 2  . simvastatin (ZOCOR) 40 MG tablet TAKE ONE TABLET BY MOUTH ONCE DAILY AT BEDTIME 30 tablet 6  . XARELTO 20 MG TABS tablet TAKE ONE TABLET BY MOUTH ONCE DAILY WITH  SUPPER 90 tablet 1   No current facility-administered medications on file prior to visit.     Allergies  Allergen Reactions  . Ace Inhibitors Cough    Other reaction(s): Cough (ALLERGY/intolerance)    Past Medical History:  Diagnosis Date  . Abnormal echocardiogram Feb 2013   biatrial enlargement. EF 45 to 50%.   . Carotid stenosis, left    moderate Left carotid stenosis  . H/O: rheumatic fever   . Hypercholesteremia   . Hypertension   . Normal nuclear stress test Oct 2009   No ischemia with EF of 63%  . Paroxysmal a-fib (HCC)    on Flecainide; converted  spontaneously and cardioverted cancelled    Past Surgical History:  Procedure Laterality Date  . CHOLECYSTECTOMY    . INGUINAL HERNIA REPAIR     Status post left inguinal hernia repair    History  Smoking Status  . Former Smoker  . Quit date: 01/31/1975  Smokeless Tobacco  . Never Used    History  Alcohol Use No    Family History  Problem Relation Age of Onset  . Heart disease Mother   . Stroke Mother     Review of Systems: The review of systems is per the HPI.  All other systems were reviewed and are negative.  Physical Exam: BP 111/68   Pulse (!) 55   Ht 6\' 1"  (1.854 m)   Wt 225 lb 12.8 oz (102.4 kg)   BMI 29.79 kg/m  Patient is very pleasant and in no acute distress.  He is overweight. Skin is warm and dry. Color is normal.  HEENT is unremarkable. Normocephalic/atraumatic. PERRL. Sclera are nonicteric. Neck is supple. No masses. No JVD. Lungs are clear. Cardiac exam shows a regular rate and rhythm. Abdomen is obese but soft. Extremities are without edema. Gait and ROM are intact. No gross neurologic deficits noted.   LABORATORY DATA: Carotid Dopplers reviewed from 05/02/16--  bilateral 40-60% ICA stenosis. No change from prior studies.   Lab Results  Component Value Date   WBC 6.2 04/10/2014  HGB 13.5 04/10/2014   HCT 38.7 (L) 04/10/2014   PLT 205 04/10/2014   GLUCOSE 94 02/25/2016   CHOL 133 02/25/2016   TRIG 90 02/25/2016   HDL 51 02/25/2016   LDLCALC 64 02/25/2016   ALT 14 02/25/2016   AST 20 02/25/2016   NA 137 02/25/2016   K 4.1 02/25/2016   CL 102 02/25/2016   CREATININE 0.83 02/25/2016   BUN 16 02/25/2016   CO2 25 02/25/2016   TSH 3.12 07/11/2011   INR 1.3 (H) 08/29/2011    Assessment / Plan: 1. Paroxysmal Atrial fibrillation, well controlled on flecainide. [place] VASC score is 3. Needs to switch anticoagulants since Xarelto is not covered on his formulary. Will switch to Eliquis 5 mg bid.  2. Carotid arterial disease. Carotid dopplers  show moderate bilateral disease. Stable. Will continue risk factor modification and repeat in one  Year.  3. Hypertension, well controlled.  4. Hyperlipidemia. Excellent control on simvastatin.   Follow up in 6 months.

## 2016-05-30 ENCOUNTER — Other Ambulatory Visit: Payer: Self-pay

## 2016-05-30 ENCOUNTER — Ambulatory Visit (INDEPENDENT_AMBULATORY_CARE_PROVIDER_SITE_OTHER): Payer: Medicare Other | Admitting: Cardiology

## 2016-05-30 ENCOUNTER — Encounter: Payer: Self-pay | Admitting: Cardiology

## 2016-05-30 VITALS — BP 111/68 | HR 55 | Ht 73.0 in | Wt 225.8 lb

## 2016-05-30 DIAGNOSIS — E7800 Pure hypercholesterolemia, unspecified: Secondary | ICD-10-CM | POA: Diagnosis not present

## 2016-05-30 DIAGNOSIS — I10 Essential (primary) hypertension: Secondary | ICD-10-CM

## 2016-05-30 DIAGNOSIS — I480 Paroxysmal atrial fibrillation: Secondary | ICD-10-CM

## 2016-05-30 MED ORDER — APIXABAN 5 MG PO TABS: 5.0000 mg | ORAL_TABLET | Freq: Two times a day (BID) | ORAL | 3 refills | Status: DC

## 2016-05-30 NOTE — Patient Instructions (Signed)
We will switch Xarelto to Eliquis 5 mg twice a day  Continue your other therapy  I will see you in 6 months

## 2016-09-08 ENCOUNTER — Other Ambulatory Visit: Payer: Self-pay | Admitting: Cardiology

## 2016-09-09 NOTE — Telephone Encounter (Signed)
Rx has been sent to the pharmacy electronically. ° °

## 2016-10-31 ENCOUNTER — Other Ambulatory Visit: Payer: Self-pay | Admitting: Cardiology

## 2016-11-26 NOTE — Progress Notes (Signed)
Henry Barnett Date of Birth: 15-Nov-1945 Medical Record #[id number]  History of Present Illness: Henry Barnett is seen for follow up Afib. He has a history of paroxysmal atrial fibrillation and is on flecainide. His other issues include HTN, obesity, ED, HLD, carotid disease.  He had a stress Myoview study in March 2014 which was normal. Carotid dopplers in November showed 40-59% bilateral disease which is stable.  On follow up today he denies any episodes of atrial fibrillation since his last visit or in the last 2 years. No dizziness or syncope. No TIA or CVA symptoms. No chest pain or SOB.  He is walking regularly and playing golf. He was switched from Xarelto to Eliquis due to cost.   Current Outpatient Prescriptions on File Prior to Visit  Medication Sig Dispense Refill  . apixaban (ELIQUIS) 5 MG TABS tablet Take 1 tablet (5 mg total) by mouth 2 (two) times daily. 180 tablet 3  . diphenhydramine-acetaminophen (TYLENOL PM) 25-500 MG TABS tablet Take 2 tablets by mouth at bedtime as needed.     . fish oil-omega-3 fatty acids 1000 MG capsule Take 2 g by mouth 2 (two) times daily.      . flecainide (TAMBOCOR) 100 MG tablet TAKE ONE TABLET BY MOUTH TWICE DAILY 180 tablet 0  . fluticasone (FLONASE) 50 MCG/ACT nasal spray Place 1 spray into both nostrils daily as needed for allergies or rhinitis.    Marland Kitchen simvastatin (ZOCOR) 40 MG tablet TAKE ONE TABLET BY MOUTH AT BEDTIME 90 tablet 2   No current facility-administered medications on file prior to visit.     Allergies  Allergen Reactions  . Ace Inhibitors Cough    Other reaction(s): Cough (ALLERGY/intolerance)    Past Medical History:  Diagnosis Date  . Abnormal echocardiogram Feb 2013   biatrial enlargement. EF 45 to 50%.   . Carotid stenosis, left    moderate Left carotid stenosis  . H/O: rheumatic fever   . Hypercholesteremia   . Hypertension   . Normal nuclear stress test Oct 2009   No ischemia with EF of 63%  . Paroxysmal A-fib  (HCC)    on Flecainide; converted spontaneously and cardioverted cancelled    Past Surgical History:  Procedure Laterality Date  . CHOLECYSTECTOMY    . INGUINAL HERNIA REPAIR     Status post left inguinal hernia repair    History  Smoking Status  . Former Smoker  . Quit date: 01/31/1975  Smokeless Tobacco  . Never Used    History  Alcohol Use No    Family History  Problem Relation Age of Onset  . Heart disease Mother   . Stroke Mother     Review of Systems: The review of systems is per the HPI.  All other systems were reviewed and are negative.  Physical Exam: BP 138/78   Pulse (!) 48   Ht 6\' 1"  (1.854 m)   Wt 236 lb 6.4 oz (107.2 kg)   BMI 31.19 kg/m  Patient is very pleasant and in no acute distress.  He is overweight. Skin is warm and dry. Color is normal.  HEENT is unremarkable. Normocephalic/atraumatic. PERRL. Sclera are nonicteric. Neck is supple. No masses. No JVD. Lungs are clear. Cardiac exam shows a regular rate and rhythm. Abdomen is obese but soft. Extremities are without edema. Gait and ROM are intact. No gross neurologic deficits noted.   LABORATORY DATA: Carotid Dopplers reviewed from 05/02/16--  bilateral 40-60% ICA stenosis. No change from prior studies.  Lab Results  Component Value Date   WBC 6.2 04/10/2014   HGB 13.5 04/10/2014   HCT 38.7 (L) 04/10/2014   PLT 205 04/10/2014   GLUCOSE 94 02/25/2016   CHOL 133 02/25/2016   TRIG 90 02/25/2016   HDL 51 02/25/2016   LDLCALC 64 02/25/2016   ALT 14 02/25/2016   AST 20 02/25/2016   NA 137 02/25/2016   K 4.1 02/25/2016   CL 102 02/25/2016   CREATININE 0.83 02/25/2016   BUN 16 02/25/2016   CO2 25 02/25/2016   TSH 3.12 07/11/2011   INR 1.3 (H) 08/29/2011   Ecg today shows sinus brady rate 48. Otherwise normal. I have personally reviewed and interpreted this study.  Assessment / Plan: 1. Paroxysmal Atrial fibrillation, well controlled on flecainide. [place] VASC score is 3. Continue Eliquis.  He does tend to run a slow HR but is asymptomatic.  2. Carotid arterial disease. Carotid dopplers show moderate bilateral disease. Stable. Will continue risk factor modification and repeat in November.  3. Hypertension, well controlled.  4. Hyperlipidemia. Excellent control on simvastatin. Repeat fasting labs in 6 months.  Follow up in 6 months.

## 2016-11-27 ENCOUNTER — Ambulatory Visit (INDEPENDENT_AMBULATORY_CARE_PROVIDER_SITE_OTHER): Payer: Medicare Other | Admitting: Cardiology

## 2016-11-27 ENCOUNTER — Encounter: Payer: Self-pay | Admitting: Cardiology

## 2016-11-27 VITALS — BP 138/78 | HR 48 | Ht 73.0 in | Wt 236.4 lb

## 2016-11-27 DIAGNOSIS — I480 Paroxysmal atrial fibrillation: Secondary | ICD-10-CM

## 2016-11-27 DIAGNOSIS — I10 Essential (primary) hypertension: Secondary | ICD-10-CM | POA: Diagnosis not present

## 2016-11-27 DIAGNOSIS — I739 Peripheral vascular disease, unspecified: Secondary | ICD-10-CM

## 2016-11-27 DIAGNOSIS — I779 Disorder of arteries and arterioles, unspecified: Secondary | ICD-10-CM

## 2016-11-27 MED ORDER — LOSARTAN POTASSIUM-HCTZ 100-12.5 MG PO TABS: 1.0000 | ORAL_TABLET | Freq: Every day | ORAL | 3 refills | Status: DC

## 2016-11-27 NOTE — Patient Instructions (Signed)
Continue your current therapy  I will see you in 6 months.   

## 2017-01-29 ENCOUNTER — Other Ambulatory Visit: Payer: Self-pay | Admitting: Cardiology

## 2017-03-02 ENCOUNTER — Other Ambulatory Visit: Payer: Self-pay | Admitting: Cardiology

## 2017-03-02 DIAGNOSIS — I779 Disorder of arteries and arterioles, unspecified: Secondary | ICD-10-CM

## 2017-03-02 DIAGNOSIS — I739 Peripheral vascular disease, unspecified: Principal | ICD-10-CM

## 2017-05-05 ENCOUNTER — Ambulatory Visit (HOSPITAL_COMMUNITY)
Admission: RE | Admit: 2017-05-05 | Discharge: 2017-05-05 | Disposition: A | Discharge status: Home / Self Care | Payer: Medicare Other | Source: Ambulatory Visit | Attending: Cardiology | Admitting: Cardiology

## 2017-05-05 DIAGNOSIS — I779 Disorder of arteries and arterioles, unspecified: Secondary | ICD-10-CM

## 2017-05-05 DIAGNOSIS — I739 Peripheral vascular disease, unspecified: Secondary | ICD-10-CM

## 2017-05-11 ENCOUNTER — Other Ambulatory Visit: Payer: Self-pay

## 2017-05-11 DIAGNOSIS — I739 Peripheral vascular disease, unspecified: Principal | ICD-10-CM

## 2017-05-11 DIAGNOSIS — I779 Disorder of arteries and arterioles, unspecified: Secondary | ICD-10-CM

## 2017-05-11 NOTE — Progress Notes (Signed)
[id number] 

## 2017-05-14 LAB — HEPATIC FUNCTION PANEL
ALT: 16 IU/L (ref 0–44)
AST: 20 IU/L (ref 0–40)
Albumin: 4.4 g/dL (ref 3.5–4.8)
Alkaline Phosphatase: 62 IU/L (ref 39–117)
BILIRUBIN TOTAL: 0.6 mg/dL (ref 0.0–1.2)
BILIRUBIN, DIRECT: 0.13 mg/dL (ref 0.00–0.40)
Total Protein: 7 g/dL (ref 6.0–8.5)

## 2017-05-14 LAB — BASIC METABOLIC PANEL
BUN/Creatinine Ratio: 25 — ABNORMAL HIGH (ref 10–24)
BUN: 21 mg/dL (ref 8–27)
CALCIUM: 9.4 mg/dL (ref 8.6–10.2)
CHLORIDE: 101 mmol/L (ref 96–106)
CO2: 22 mmol/L (ref 20–29)
Creatinine, Ser: 0.83 mg/dL (ref 0.76–1.27)
GFR calc Af Amer: 101 mL/min/{1.73_m2} (ref >59)
GFR, EST NON AFRICAN AMERICAN: 87 mL/min/{1.73_m2} (ref >59)
GLUCOSE: 101 mg/dL — AB (ref 65–99)
POTASSIUM: 3.7 mmol/L (ref 3.5–5.2)
SODIUM: 137 mmol/L (ref 134–144)

## 2017-05-14 LAB — LIPID PANEL
CHOL/HDL RATIO: 2.5 ratio (ref 0.0–5.0)
CHOLESTEROL TOTAL: 128 mg/dL (ref 100–199)
HDL: 51 mg/dL (ref >39)
LDL Calculated: 58 mg/dL (ref 0–99)
TRIGLYCERIDES: 97 mg/dL (ref 0–149)
VLDL Cholesterol Cal: 19 mg/dL (ref 5–40)

## 2017-05-26 ENCOUNTER — Ambulatory Visit: Payer: Medicare Other | Admitting: Cardiology

## 2017-05-30 ENCOUNTER — Other Ambulatory Visit: Payer: Self-pay | Admitting: Cardiology

## 2017-06-08 ENCOUNTER — Other Ambulatory Visit: Payer: Self-pay | Admitting: Cardiology

## 2017-06-08 NOTE — Telephone Encounter (Signed)
REFILL 

## 2017-06-30 NOTE — Progress Notes (Signed)
Henry Barnett Date of Birth: [birth year] Medical Record #[id number]  History of Present Illness: Henry Barnett is seen for follow up Afib. He has a history of paroxysmal atrial fibrillation and is on flecainide. His other issues include HTN, obesity, ED, HLD, carotid disease.  He had a stress Myoview study in March 2014 which was normal. Carotid dopplers in November 2018 showed 40-59% bilateral disease which is stable.   On follow up today he feels very well. No palpitations. No dizziness or syncope. No TIA or CVA symptoms. No chest pain or SOB.  He is walking regularly and enjoys hiking. [facility] [employer]. He has gained about 8 lbs and is planning to work on his diet more.   Current Outpatient Medications on File Prior to Visit  Medication Sig Dispense Refill  . diphenhydramine-acetaminophen (TYLENOL PM) 25-500 MG TABS tablet Take 2 tablets by mouth at bedtime as needed.     Marland Kitchen ELIQUIS 5 MG TABS tablet TAKE ONE TABLET BY MOUTH TWICE DAILY 180 tablet 1  . fish oil-omega-3 fatty acids 1000 MG capsule Take 2 g by mouth 2 (two) times daily.      . flecainide (TAMBOCOR) 100 MG tablet TAKE 1 TABLET BY MOUTH TWICE DAILY 180 tablet 3  . fluticasone (FLONASE) 50 MCG/ACT nasal spray Place 1 spray into both nostrils daily as needed for allergies or rhinitis.    Marland Kitchen losartan-hydrochlorothiazide (HYZAAR) 100-12.5 MG tablet Take 1 tablet by mouth daily. 90 tablet 3  . simvastatin (ZOCOR) 40 MG tablet TAKE 1 TABLET BY MOUTH ONCE DAILY AT BEDTIME 90 tablet 0   No current facility-administered medications on file prior to visit.     Allergies  Allergen Reactions  . Ace Inhibitors Cough    Other reaction(s): Cough (ALLERGY/intolerance)    Past Medical History:  Diagnosis Date  . Abnormal echocardiogram Feb 2013   biatrial enlargement. EF 45 to 50%.   . Carotid stenosis, left    moderate Left carotid stenosis  . H/O: rheumatic fever   . Hypercholesteremia   . Hypertension   . Normal nuclear  stress test Oct 2009   No ischemia with EF of 63%  . Paroxysmal A-fib (HCC)    on Flecainide; converted spontaneously and cardioverted cancelled    Past Surgical History:  Procedure Laterality Date  . CHOLECYSTECTOMY    . INGUINAL HERNIA REPAIR     Status post left inguinal hernia repair    Social History   Tobacco Use  Smoking Status Former Smoker  . Last attempt to quit: 01/31/1975  . Years since quitting: 42.4  Smokeless Tobacco Never Used    Social History   Substance and Sexual Activity  Alcohol Use No    Family History  Problem Relation Age of Onset  . Heart disease Mother   . Stroke Mother     Review of Systems: The review of systems is per the HPI.  All other systems were reviewed and are negative.  Physical Exam: BP 130/76 (BP Location: Right Arm, Patient Position: Sitting, Cuff Size: Large)   Pulse (!) 56   Ht 6\' 1"  (1.854 m)   Wt 244 lb (110.7 kg)   BMI 32.19 kg/m  GENERAL:  Well appearing, obese WM HEENT:  PERRL, EOMI, sclera are clear. Oropharynx is clear. NECK:  No jugular venous distention, carotid upstroke brisk and symmetric, no bruits, no thyromegaly or adenopathy LUNGS:  Clear to auscultation bilaterally CHEST:  Unremarkable HEART:  RRR with occ extrasystole.   PMI not  displaced or sustained,S1 and S2 within normal limits, no S3, no S4: no clicks, no rubs, no murmurs ABD:  Soft, nontender. BS +, no masses or bruits. No hepatomegaly, no splenomegaly EXT:  2 + pulses throughout, no edema, no cyanosis no clubbing SKIN:  Warm and dry.  No rashes NEURO:  Alert and oriented x 3. Cranial nerves II through XII intact. PSYCH:  Cognitively intact     LABORATORY DATA: Carotid Dopplers reviewed from 05/05/17-  bilateral 40-60% ICA stenosis. No change from prior studies. I have personally reviewed and interpreted this study.    Lab Results  Component Value Date   WBC 6.2 04/10/2014   HGB 13.5 04/10/2014   HCT 38.7 (L) 04/10/2014   PLT 205  04/10/2014   GLUCOSE 101 (H) 05/14/2017   CHOL 128 05/14/2017   TRIG 97 05/14/2017   HDL 51 05/14/2017   LDLCALC 58 05/14/2017   ALT 16 05/14/2017   AST 20 05/14/2017   NA 137 05/14/2017   K 3.7 05/14/2017   CL 101 05/14/2017   CREATININE 0.83 05/14/2017   BUN 21 05/14/2017   CO2 22 05/14/2017   TSH 3.12 07/11/2011   INR 1.3 (H) 08/29/2011    Assessment / Plan: 1. Paroxysmal Atrial fibrillation, well controlled on flecainide. [place] VASC score is 3. Continue Eliquis. He is asymptomatic.  2. Carotid arterial disease. Carotid dopplers show moderate bilateral disease. This is unchanged. Will continue risk factor modification and repeat yearly doppler.  3. Hypertension, well controlled.  4. Hyperlipidemia. Excellent control on simvastatin.   Focus on weight loss. Otherwise continue current therapy. Follow up in 6 months.

## 2017-07-02 ENCOUNTER — Ambulatory Visit (INDEPENDENT_AMBULATORY_CARE_PROVIDER_SITE_OTHER): Payer: Medicare HMO | Admitting: Cardiology

## 2017-07-02 ENCOUNTER — Encounter: Payer: Self-pay | Admitting: Cardiology

## 2017-07-02 VITALS — BP 130/76 | HR 56 | Ht 73.0 in | Wt 244.0 lb

## 2017-07-02 DIAGNOSIS — I10 Essential (primary) hypertension: Secondary | ICD-10-CM | POA: Diagnosis not present

## 2017-07-02 DIAGNOSIS — I480 Paroxysmal atrial fibrillation: Secondary | ICD-10-CM

## 2017-07-02 DIAGNOSIS — I6523 Occlusion and stenosis of bilateral carotid arteries: Secondary | ICD-10-CM | POA: Diagnosis not present

## 2017-07-02 NOTE — Patient Instructions (Signed)
Continue your current therapy  Focus on weight loss and continue with your exercise  I will see you in 6 months

## 2017-07-21 ENCOUNTER — Telehealth: Payer: Self-pay | Admitting: Cardiology

## 2017-07-21 NOTE — Telephone Encounter (Signed)
   [facility] Pre-operative Risk Assessment    Request for surgical clearance:  1. What type of surgery is being performed? Colonoscopy - screening   2. When is this surgery scheduled? 08/03/17   3. What type of clearance is required (medical clearance vs. Pharmacy clearance to hold med vs. Both)? pharmacy  4. Are there any medications that need to be held prior to surgery and how long?Eliquis - 1 day prior   5. Practice name and name of physician performing surgery? Dr. Therisa Doyne @ [facility]   6. What is your office phone and fax number? (p) [phone]  (f) [phone]   7. Anesthesia type (None, local, MAC, general) ? Not specified    Henry Barnett 07/21/2017, 5:00 PM  _________________________________________________________________   (provider comments below)

## 2017-07-22 NOTE — Telephone Encounter (Signed)
   Primary Cardiologist: Peter SwazilandJordan, MD  Chart reviewed as part of pre-operative protocol coverage. Patient was contacted 07/22/2017 in reference to pre-operative risk assessment for pending surgery as outlined below.  Jake SharkHarold Longhi was last seen on 07/02/17 by Dr. SwazilandJordan.  Since that day, Richardean ChimeraHarold Whilden has done well. He can complete more than 4.0 METS.  Therefore, based on ACC/AHA guidelines, the patient would be at acceptable risk for the planned procedure without further cardiovascular testing.   I will route this message to pharmacy to give recommendations for holding eliquis.   Roe RutherfordAngela Nicole Duke, PA 07/22/2017, 2:20 PM

## 2017-07-22 NOTE — Telephone Encounter (Signed)
Patient with diagnosis of Afib on Eliquis for anticoagulation.    Procedure: colonoscopy Date of procedure: 08/03/17  CHADS2-VASc score of 3  (CHF, HTN, AGE, DM2, stroke/tia x 2, CAD, AGE, male)  CrCl 1122ml/min  Per office protocol, patient can hold Eliquis for 24 hours prior to procedure.

## 2017-07-23 NOTE — Telephone Encounter (Signed)
   Primary Cardiologist: Peter SwazilandJordan, MD  Chart reviewed as part of pre-operative protocol coverage. Patient was contacted 07/22/2017 in reference to pre-operative risk assessment for pending surgery as outlined below.  Henry Barnett was last seen on 07/02/17 by Dr. SwazilandJordan.  Since that day, Henry Barnett has done well. He can complete more than 4.0 METS.  Therefore, based on ACC/AHA guidelines, the patient would be at acceptable risk for the planned procedure without further cardiovascular testing.   Per pharmacy, OK to hold eliquis for 24 hr prior to procedure.  I will route this recommendation to the requesting party via Epic fax function and remove from pre-op pool.  Preop call back pool, please call office and make sure they received this documentation.  Please call with questions.  Henry RutherfordAngela Nicole Jaydy Fitzhenry, PA 07/23/2017, 1:40 PM

## 2017-07-23 NOTE — Telephone Encounter (Signed)
Notified [facility] staff that clearance was sent this afternoon and staff also took verbal message to pass along to Dr. Marca AnconaKarki

## 2017-09-07 ENCOUNTER — Other Ambulatory Visit: Payer: Self-pay | Admitting: Cardiology

## 2017-09-08 NOTE — Telephone Encounter (Signed)
Rx has been sent to the pharmacy electronically. ° °

## 2017-11-27 ENCOUNTER — Other Ambulatory Visit: Payer: Self-pay | Admitting: Cardiology

## 2017-11-30 ENCOUNTER — Other Ambulatory Visit: Payer: Self-pay

## 2017-12-01 MED ORDER — APIXABAN 5 MG PO TABS: 5.0000 mg | ORAL_TABLET | Freq: Two times a day (BID) | ORAL | 1 refills | Status: DC

## 2017-12-09 ENCOUNTER — Other Ambulatory Visit: Payer: Self-pay | Admitting: Cardiology

## 2017-12-09 ENCOUNTER — Other Ambulatory Visit: Payer: Self-pay

## 2017-12-09 DIAGNOSIS — I480 Paroxysmal atrial fibrillation: Secondary | ICD-10-CM

## 2017-12-28 NOTE — Progress Notes (Signed)
Henry Barnett Date of Birth: 07-Mar-1946 Medical Record #[id number]  History of Present Illness: Mr. Scalzo is seen for follow up Afib. He has a history of paroxysmal atrial fibrillation and is on flecainide. His other issues include HTN, obesity, ED, HLD, carotid disease.  He had a stress Myoview study in March 2014 which was normal. Carotid dopplers in November 2018 showed 40-59% bilateral disease which is stable.   On follow up today he feels very well. No palpitations. No dizziness or syncope. No TIA or CVA symptoms. No chest pain or SOB.  He is walking regularly and enjoys hiking. Walks 5 miles a day. he has lost 5 lbs. Notes HR at home typically 48-50 but does increase with exertion. He wears a Loss adjuster, chartered bit.    Current Outpatient Medications on File Prior to Visit  Medication Sig Dispense Refill  . apixaban (ELIQUIS) 5 MG TABS tablet Take 1 tablet (5 mg total) by mouth 2 (two) times daily. 180 tablet 1  . diphenhydramine-acetaminophen (TYLENOL PM) 25-500 MG TABS tablet Take 2 tablets by mouth at bedtime as needed.     . fish oil-omega-3 fatty acids 1000 MG capsule Take 2 g by mouth 2 (two) times daily.      . flecainide (TAMBOCOR) 100 MG tablet TAKE 1 TABLET BY MOUTH TWICE DAILY 180 tablet 3  . fluticasone (FLONASE) 50 MCG/ACT nasal spray Place 1 spray into both nostrils daily as needed for allergies or rhinitis.    Marland Kitchen losartan-hydrochlorothiazide (HYZAAR) 100-12.5 MG tablet TAKE 1 TABLET BY MOUTH ONCE DAILY 90 tablet 2  . simvastatin (ZOCOR) 40 MG tablet TAKE 1 TABLET BY MOUTH ONCE DAILY AT BEDTIME 90 tablet 2   No current facility-administered medications on file prior to visit.     Allergies  Allergen Reactions  . Ace Inhibitors Cough    Other reaction(s): Cough (ALLERGY/intolerance)    Past Medical History:  Diagnosis Date  . Abnormal echocardiogram Feb 2013   biatrial enlargement. EF 45 to 50%.   . Carotid stenosis, left    moderate Left carotid stenosis  . H/O: rheumatic  fever   . Hypercholesteremia   . Hypertension   . Normal nuclear stress test Oct 2009   No ischemia with EF of 63%  . Paroxysmal A-fib (HCC)    on Flecainide; converted spontaneously and cardioverted cancelled    Past Surgical History:  Procedure Laterality Date  . CHOLECYSTECTOMY    . INGUINAL HERNIA REPAIR     Status post left inguinal hernia repair    Social History   Tobacco Use  Smoking Status Former Smoker  . Last attempt to quit: 01/31/1975  . Years since quitting: 42.9  Smokeless Tobacco Never Used    Social History   Substance and Sexual Activity  Alcohol Use No    Family History  Problem Relation Age of Onset  . Heart disease Mother   . Stroke Mother     Review of Systems: The review of systems is per the HPI.  All other systems were reviewed and are negative.  Physical Exam: BP 132/70   Pulse (!) 45   Ht 6\' 1"  (1.854 m)   Wt 239 lb (108.4 kg)   BMI 31.53 kg/m  GENERAL:  Well appearing, obese WM HEENT:  PERRL, EOMI, sclera are clear. Oropharynx is clear. NECK:  No jugular venous distention, carotid upstroke brisk and symmetric, no bruits, no thyromegaly or adenopathy LUNGS:  Clear to auscultation bilaterally CHEST:  Unremarkable HEART:  RRR with occ  extrasystole.   PMI not displaced or sustained,S1 and S2 within normal limits, no S3, no S4: no clicks, no rubs, no murmurs ABD:  Soft, nontender. BS +, no masses or bruits. No hepatomegaly, no splenomegaly EXT:  2 + pulses throughout, no edema, no cyanosis no clubbing SKIN:  Warm and dry.  No rashes NEURO:  Alert and oriented x 3. Cranial nerves II through XII intact. PSYCH:  Cognitively intact     LABORATORY DATA: Carotid Dopplers reviewed from 05/05/17-  bilateral 40-60% ICA stenosis. No change from prior studies. I have personally reviewed and interpreted this study.  Ecg today shows sinus bradycardia rate 45. Otherwise normal. I have personally reviewed and interpreted this study.     Lab  Results  Component Value Date   WBC 6.2 04/10/2014   HGB 13.5 04/10/2014   HCT 38.7 (L) 04/10/2014   PLT 205 04/10/2014   GLUCOSE 101 (H) 05/14/2017   CHOL 128 05/14/2017   TRIG 97 05/14/2017   HDL 51 05/14/2017   LDLCALC 58 05/14/2017   ALT 16 05/14/2017   AST 20 05/14/2017   NA 137 05/14/2017   K 3.7 05/14/2017   CL 101 05/14/2017   CREATININE 0.83 05/14/2017   BUN 21 05/14/2017   CO2 22 05/14/2017   TSH 3.12 07/11/2011   INR 1.3 (H) 08/29/2011    Assessment / Plan: 1. Paroxysmal Atrial fibrillation, well controlled on flecainide. [place] VASC score is 3. Continue Eliquis. He is asymptomatic. Last episode of AFib was over 3 years ago.   2. Carotid arterial disease. Carotid dopplers show moderate bilateral disease. This is unchanged. Will continue risk factor modification and repeat yearly doppler.  3. Hypertension, well controlled.  4. Hyperlipidemia. Excellent control on simvastatin.   5. Sinus bradycardia. He is asymptomatic. No indication for pacemaker at this time.   Continue current therapy. Follow up in 6 months.

## 2017-12-31 ENCOUNTER — Ambulatory Visit (INDEPENDENT_AMBULATORY_CARE_PROVIDER_SITE_OTHER): Payer: Medicare HMO | Admitting: Cardiology

## 2017-12-31 ENCOUNTER — Encounter: Payer: Self-pay | Admitting: Cardiology

## 2017-12-31 VITALS — BP 132/70 | HR 45 | Ht 73.0 in | Wt 239.0 lb

## 2017-12-31 DIAGNOSIS — E7800 Pure hypercholesterolemia, unspecified: Secondary | ICD-10-CM | POA: Diagnosis not present

## 2017-12-31 DIAGNOSIS — I6523 Occlusion and stenosis of bilateral carotid arteries: Secondary | ICD-10-CM | POA: Diagnosis not present

## 2017-12-31 DIAGNOSIS — I480 Paroxysmal atrial fibrillation: Secondary | ICD-10-CM | POA: Diagnosis not present

## 2017-12-31 DIAGNOSIS — I10 Essential (primary) hypertension: Secondary | ICD-10-CM | POA: Diagnosis not present

## 2017-12-31 NOTE — Patient Instructions (Addendum)
Continue your current therapy  I will see you in 6 months - please have fasting blood work prior to your next appointment.

## 2018-01-21 ENCOUNTER — Emergency Department (HOSPITAL_COMMUNITY)
Admission: EM | Admit: 2018-01-21 | Discharge: 2018-01-21 | Disposition: A | Discharge status: Home / Self Care | Payer: Medicare HMO | Attending: Emergency Medicine | Admitting: Emergency Medicine

## 2018-01-21 ENCOUNTER — Encounter (HOSPITAL_COMMUNITY): Payer: Self-pay

## 2018-01-21 ENCOUNTER — Emergency Department (HOSPITAL_COMMUNITY): Payer: Medicare HMO

## 2018-01-21 DIAGNOSIS — Y999 Unspecified external cause status: Secondary | ICD-10-CM | POA: Diagnosis not present

## 2018-01-21 DIAGNOSIS — Y92414 Local residential or business street as the place of occurrence of the external cause: Secondary | ICD-10-CM | POA: Diagnosis not present

## 2018-01-21 DIAGNOSIS — I10 Essential (primary) hypertension: Secondary | ICD-10-CM | POA: Insufficient documentation

## 2018-01-21 DIAGNOSIS — Z7901 Long term (current) use of anticoagulants: Secondary | ICD-10-CM | POA: Diagnosis not present

## 2018-01-21 DIAGNOSIS — M549 Dorsalgia, unspecified: Secondary | ICD-10-CM | POA: Insufficient documentation

## 2018-01-21 DIAGNOSIS — Z87891 Personal history of nicotine dependence: Secondary | ICD-10-CM | POA: Diagnosis not present

## 2018-01-21 DIAGNOSIS — R0789 Other chest pain: Secondary | ICD-10-CM | POA: Diagnosis not present

## 2018-01-21 DIAGNOSIS — Y9389 Activity, other specified: Secondary | ICD-10-CM | POA: Diagnosis not present

## 2018-01-21 MED ORDER — ACETAMINOPHEN 500 MG PO TABS
1000.0000 mg | ORAL_TABLET | Freq: Once | ORAL | Status: AC
  Administered 2018-01-21: 1000 mg via ORAL
  Filled 2018-01-21: qty 2

## 2018-01-21 NOTE — Discharge Instructions (Signed)
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
## 2018-01-25 ENCOUNTER — Other Ambulatory Visit: Payer: Self-pay | Admitting: Cardiology

## 2018-05-10 ENCOUNTER — Other Ambulatory Visit: Payer: Self-pay | Admitting: Cardiology

## 2018-05-10 ENCOUNTER — Ambulatory Visit (HOSPITAL_COMMUNITY)
Admission: RE | Admit: 2018-05-10 | Discharge: 2018-05-10 | Disposition: A | Discharge status: Home / Self Care | Payer: Medicare HMO | Source: Ambulatory Visit | Attending: Cardiovascular Disease | Admitting: Cardiovascular Disease

## 2018-05-10 DIAGNOSIS — I6523 Occlusion and stenosis of bilateral carotid arteries: Secondary | ICD-10-CM | POA: Insufficient documentation

## 2018-05-10 LAB — COMPREHENSIVE METABOLIC PANEL
ALT: 17 IU/L (ref 0–44)
AST: 19 IU/L (ref 0–40)
Albumin/Globulin Ratio: 1.7 (ref 1.2–2.2)
Albumin: 4.4 g/dL (ref 3.5–4.8)
Alkaline Phosphatase: 65 IU/L (ref 39–117)
BUN/Creatinine Ratio: 24 (ref 10–24)
BUN: 20 mg/dL (ref 8–27)
Bilirubin Total: 0.6 mg/dL (ref 0.0–1.2)
CO2: 22 mmol/L (ref 20–29)
CREATININE: 0.82 mg/dL (ref 0.76–1.27)
Calcium: 8.9 mg/dL (ref 8.6–10.2)
Chloride: 100 mmol/L (ref 96–106)
GFR calc Af Amer: 101 mL/min/{1.73_m2} (ref >59)
GFR, EST NON AFRICAN AMERICAN: 87 mL/min/{1.73_m2} (ref >59)
Globulin, Total: 2.6 g/dL (ref 1.5–4.5)
Glucose: 97 mg/dL (ref 65–99)
POTASSIUM: 3.9 mmol/L (ref 3.5–5.2)
Sodium: 137 mmol/L (ref 134–144)
Total Protein: 7 g/dL (ref 6.0–8.5)

## 2018-05-10 LAB — LIPID PANEL
CHOL/HDL RATIO: 2.7 ratio (ref 0.0–5.0)
Cholesterol, Total: 136 mg/dL (ref 100–199)
HDL: 51 mg/dL (ref >39)
LDL Calculated: 70 mg/dL (ref 0–99)
Triglycerides: 75 mg/dL (ref 0–149)
VLDL Cholesterol Cal: 15 mg/dL (ref 5–40)

## 2018-06-04 ENCOUNTER — Other Ambulatory Visit: Payer: Self-pay | Admitting: Cardiology

## 2018-06-04 NOTE — Telephone Encounter (Signed)
Rx sent to pharmacy   

## 2018-07-01 ENCOUNTER — Encounter: Payer: Self-pay | Admitting: Cardiology

## 2018-07-15 NOTE — Progress Notes (Signed)
Henry Barnett Date of Birth: 11-17-1945 Medical Record #[id number]  History of Present Illness: Henry Barnett is seen for follow up Afib. He has a history of paroxysmal atrial fibrillation and is on flecainide. His other issues include HTN, obesity, ED, HLD, carotid disease.  He had a stress Myoview study in March 2014 which was normal. Carotid dopplers in November 2019 showed 40-59% bilateral disease which is stable.   On follow up today he is doing well from a cardiac standpoint. He has noted some mild abdominal discomfort on the left and notes his stools are more irregular with a lot of gas and "greasy" stools.  No palpitations. No dizziness or syncope. No TIA or CVA symptoms. No chest pain or SOB.    Current Outpatient Medications on File Prior to Visit  Medication Sig Dispense Refill  . apixaban (ELIQUIS) 5 MG TABS tablet Take 1 tablet (5 mg total) by mouth 2 (two) times daily. 180 tablet 1  . diphenhydramine-acetaminophen (TYLENOL PM) 25-500 MG TABS tablet Take 2 tablets by mouth at bedtime as needed.     . fish oil-omega-3 fatty acids 1000 MG capsule Take 2 g by mouth 2 (two) times daily.      . flecainide (TAMBOCOR) 100 MG tablet TAKE 1 TABLET BY MOUTH TWICE DAILY 180 tablet 3  . fluticasone (FLONASE) 50 MCG/ACT nasal spray Place 1 spray into both nostrils daily as needed for allergies or rhinitis.    Marland Kitchen losartan-hydrochlorothiazide (HYZAAR) 100-12.5 MG tablet TAKE 1 TABLET BY MOUTH ONCE DAILY 90 tablet 2  . simvastatin (ZOCOR) 40 MG tablet TAKE 1 TABLET BY MOUTH ONCE DAILY AT BEDTIME 90 tablet 0   No current facility-administered medications on file prior to visit.     Allergies  Allergen Reactions  . Ace Inhibitors Cough    Other reaction(s): Cough (ALLERGY/intolerance)    Past Medical History:  Diagnosis Date  . Abnormal echocardiogram Feb 2013   biatrial enlargement. EF 45 to 50%.   . Carotid stenosis, left    moderate Left carotid stenosis  . H/O: rheumatic fever   .  Hypercholesteremia   . Hypertension   . Normal nuclear stress test Oct 2009   No ischemia with EF of 63%  . Paroxysmal A-fib (HCC)    on Flecainide; converted spontaneously and cardioverted cancelled    Past Surgical History:  Procedure Laterality Date  . CHOLECYSTECTOMY    . INGUINAL HERNIA REPAIR     Status post left inguinal hernia repair    Social History   Tobacco Use  Smoking Status Former Smoker  . Last attempt to quit: 01/31/1975  . Years since quitting: 43.4  Smokeless Tobacco Never Used    Social History   Substance and Sexual Activity  Alcohol Use No    Family History  Problem Relation Age of Onset  . Heart disease Mother   . Stroke Mother     Review of Systems: The review of systems is per the HPI.  All other systems were reviewed and are negative.  Physical Exam: BP 128/76   Pulse (!) 51   Ht 6\' 1"  (1.854 m)   Wt 242 lb (109.8 kg)   SpO2 93%   BMI 31.93 kg/m  GENERAL:  Well appearing, obese WM in NAD HEENT:  PERRL, EOMI, sclera are clear. Oropharynx is clear. NECK:  No jugular venous distention, carotid upstroke brisk and symmetric, no bruits, no thyromegaly or adenopathy LUNGS:  Clear to auscultation bilaterally CHEST:  Unremarkable HEART:  RRR,  PMI not displaced or sustained,S1 and S2 within normal limits, no S3, no S4: no clicks, no rubs, no murmurs ABD:  Soft, nontender. BS +, no masses or bruits. No hepatomegaly, no splenomegaly EXT:  2 + pulses throughout, no edema, no cyanosis no clubbing SKIN:  Warm and dry.  No rashes NEURO:  Alert and oriented x 3. Cranial nerves II through XII intact. PSYCH:  Cognitively intact     LABORATORY DATA:  Lab Results  Component Value Date   WBC 6.2 04/10/2014   HGB 13.5 04/10/2014   HCT 38.7 (L) 04/10/2014   PLT 205 04/10/2014   GLUCOSE 97 05/10/2018   CHOL 136 05/10/2018   TRIG 75 05/10/2018   HDL 51 05/10/2018   LDLCALC 70 05/10/2018   ALT 17 05/10/2018   AST 19 05/10/2018   NA 137  05/10/2018   K 3.9 05/10/2018   CL 100 05/10/2018   CREATININE 0.82 05/10/2018   BUN 20 05/10/2018   CO2 22 05/10/2018   TSH 3.12 07/11/2011   INR 1.3 (H) 08/29/2011    Assessment / Plan: 1. Paroxysmal Atrial fibrillation, well controlled on flecainide. No episodes in 3-4 years. [place] VASC score is 3. Continue Eliquis.   2. Carotid arterial disease. Carotid dopplers show moderate bilateral disease. Repeat 05/10/18 showed 40-59% bilateral stenosis. This is unchanged. Will continue risk factor modification and repeat yearly doppler.  3. Hypertension, well controlled.  4. Hyperlipidemia. Excellent control on simvastatin.    Continue current therapy. Follow up in 6 months.

## 2018-07-19 ENCOUNTER — Ambulatory Visit: Payer: Medicare HMO | Admitting: Cardiology

## 2018-07-19 ENCOUNTER — Encounter: Payer: Self-pay | Admitting: Cardiology

## 2018-07-19 VITALS — BP 128/76 | HR 51 | Ht 73.0 in | Wt 242.0 lb

## 2018-07-19 DIAGNOSIS — I10 Essential (primary) hypertension: Secondary | ICD-10-CM

## 2018-07-19 DIAGNOSIS — E7800 Pure hypercholesterolemia, unspecified: Secondary | ICD-10-CM

## 2018-07-19 DIAGNOSIS — I739 Peripheral vascular disease, unspecified: Secondary | ICD-10-CM

## 2018-07-19 DIAGNOSIS — I779 Disorder of arteries and arterioles, unspecified: Secondary | ICD-10-CM

## 2018-07-19 DIAGNOSIS — I480 Paroxysmal atrial fibrillation: Secondary | ICD-10-CM | POA: Diagnosis not present

## 2018-09-01 ENCOUNTER — Other Ambulatory Visit: Payer: Self-pay | Admitting: Cardiology

## 2018-09-15 ENCOUNTER — Other Ambulatory Visit: Payer: Self-pay | Admitting: Cardiology

## 2018-09-15 DIAGNOSIS — I480 Paroxysmal atrial fibrillation: Secondary | ICD-10-CM

## 2018-09-15 NOTE — Telephone Encounter (Signed)
Refill Hyzaar.

## 2018-12-01 ENCOUNTER — Other Ambulatory Visit: Payer: Self-pay | Admitting: Cardiology

## 2018-12-01 NOTE — Telephone Encounter (Signed)
Pt is a 73yom requesting eliquis 5mg , wt of 109.8kg, scr 0.82(05/10/18), lov w/Dr. Martinique 07/19/18) I would refill for 43mos DXAFIB

## 2019-01-19 ENCOUNTER — Other Ambulatory Visit: Payer: Self-pay | Admitting: Cardiology

## 2019-02-20 NOTE — Progress Notes (Signed)
Henry Barnett Date of Birth: Nov 10, 1945 Medical Record #[id number]  History of Present Illness: Henry Barnett is seen for follow up Afib. He has a history of paroxysmal atrial fibrillation and is on flecainide. His other issues include HTN, obesity, ED, HLD, carotid disease.  He had a stress Myoview study in March 2014 which was normal. Carotid dopplers in November 2019 showed 40-59% bilateral disease which is stable.   On follow up today he is doing well from a cardiac standpoint. He denies any palpitations. No dizziness or syncope. No TIA or CVA symptoms. No chest pain or SOB.    Current Outpatient Medications on File Prior to Visit  Medication Sig Dispense Refill  . diphenhydramine-acetaminophen (TYLENOL PM) 25-500 MG TABS tablet Take 2 tablets by mouth at bedtime as needed.     Marland Kitchen ELIQUIS 5 MG TABS tablet Take 1 tablet by mouth twice daily 180 tablet 0  . fish oil-omega-3 fatty acids 1000 MG capsule Take 2 g by mouth 2 (two) times daily.      . flecainide (TAMBOCOR) 100 MG tablet Take 1 tablet by mouth twice daily 60 tablet 0  . fluticasone (FLONASE) 50 MCG/ACT nasal spray Place 1 spray into both nostrils daily as needed for allergies or rhinitis.    Marland Kitchen losartan-hydrochlorothiazide (HYZAAR) 100-12.5 MG tablet Take 1 tablet by mouth once daily 90 tablet 1  . simvastatin (ZOCOR) 40 MG tablet TAKE 1 TABLET BY MOUTH ONCE DAILY AT BEDTIME 90 tablet 1   No current facility-administered medications on file prior to visit.     Allergies  Allergen Reactions  . Ace Inhibitors Cough    Other reaction(s): Cough (ALLERGY/intolerance)    Past Medical History:  Diagnosis Date  . Abnormal echocardiogram Feb 2013   biatrial enlargement. EF 45 to 50%.   . Carotid stenosis, left    moderate Left carotid stenosis  . H/O: rheumatic fever   . Hypercholesteremia   . Hypertension   . Normal nuclear stress test Oct 2009   No ischemia with EF of 63%  . Paroxysmal A-fib (HCC)    on Flecainide;  converted spontaneously and cardioverted cancelled    Past Surgical History:  Procedure Laterality Date  . CHOLECYSTECTOMY    . INGUINAL HERNIA REPAIR     Status post left inguinal hernia repair    Social History   Tobacco Use  Smoking Status Former Smoker  . Quit date: 01/31/1975  . Years since quitting: 44.0  Smokeless Tobacco Never Used    Social History   Substance and Sexual Activity  Alcohol Use No    Family History  Problem Relation Age of Onset  . Heart disease Mother   . Stroke Mother     Review of Systems: The review of systems is per the HPI.  All other systems were reviewed and are negative.  Physical Exam: BP 136/72   Pulse 68   Temp (!) 97 F (36.1 C)   Ht 6\' 1"  (1.854 m)   Wt 244 lb (110.7 kg)   SpO2 95%   BMI 32.19 kg/m  GENERAL:  Well appearing, obese WM in NAD HEENT:  PERRL, EOMI, sclera are clear. Oropharynx is clear. NECK:  No jugular venous distention, carotid upstroke brisk and symmetric, no bruits, no thyromegaly or adenopathy LUNGS:  Clear to auscultation bilaterally CHEST:  Unremarkable HEART:  RRR,  PMI not displaced or sustained,S1 and S2 within normal limits, no S3, no S4: no clicks, no rubs, no murmurs ABD:  Soft, nontender.  BS +, no masses or bruits. No hepatomegaly, no splenomegaly EXT:  2 + pulses throughout, no edema, no cyanosis no clubbing SKIN:  Warm and dry.  No rashes NEURO:  Alert and oriented x 3. Cranial nerves II through XII intact. PSYCH:  Cognitively intact   LABORATORY DATA:  Lab Results  Component Value Date   WBC 6.2 04/10/2014   HGB 13.5 04/10/2014   HCT 38.7 (L) 04/10/2014   PLT 205 04/10/2014   GLUCOSE 97 05/10/2018   CHOL 136 05/10/2018   TRIG 75 05/10/2018   HDL 51 05/10/2018   LDLCALC 70 05/10/2018   ALT 17 05/10/2018   AST 19 05/10/2018   NA 137 05/10/2018   K 3.9 05/10/2018   CL 100 05/10/2018   CREATININE 0.82 05/10/2018   BUN 20 05/10/2018   CO2 22 05/10/2018   TSH 3.12 07/11/2011    INR 1.3 (H) 08/29/2011   Ecg today shows NSR rate 53. Normal. I have personally reviewed and interpreted this study.  Assessment / Plan: 1. Paroxysmal Atrial fibrillation, well controlled on flecainide. No episodes in 4 years. [place] VASC score is 3. Continue Eliquis.   2. Carotid arterial disease. Carotid dopplers show moderate bilateral disease. Repeat 05/10/18 showed 40-59% bilateral stenosis. Will continue risk factor modification and repeat yearly doppler.  3. Hypertension, well controlled.  4. Hyperlipidemia. Good control on simvastatin.    Continue current therapy. Follow up in 6 months.

## 2019-02-25 ENCOUNTER — Encounter: Payer: Self-pay | Admitting: Cardiology

## 2019-02-25 ENCOUNTER — Other Ambulatory Visit: Payer: Self-pay

## 2019-02-25 ENCOUNTER — Ambulatory Visit (INDEPENDENT_AMBULATORY_CARE_PROVIDER_SITE_OTHER): Payer: Medicare HMO | Admitting: Cardiology

## 2019-02-25 VITALS — BP 136/72 | HR 68 | Temp 97.0°F | Ht 73.0 in | Wt 244.0 lb

## 2019-02-25 DIAGNOSIS — I779 Disorder of arteries and arterioles, unspecified: Secondary | ICD-10-CM

## 2019-02-25 DIAGNOSIS — E7800 Pure hypercholesterolemia, unspecified: Secondary | ICD-10-CM | POA: Diagnosis not present

## 2019-02-25 DIAGNOSIS — I10 Essential (primary) hypertension: Secondary | ICD-10-CM

## 2019-02-25 DIAGNOSIS — I739 Peripheral vascular disease, unspecified: Secondary | ICD-10-CM

## 2019-02-25 DIAGNOSIS — I480 Paroxysmal atrial fibrillation: Secondary | ICD-10-CM

## 2019-02-25 MED ORDER — SIMVASTATIN 40 MG PO TABS: 40.0000 mg | ORAL_TABLET | Freq: Every day | ORAL | 3 refills | Status: DC

## 2019-02-25 MED ORDER — LOSARTAN POTASSIUM-HCTZ 100-12.5 MG PO TABS: 1.0000 | ORAL_TABLET | Freq: Every day | ORAL | 3 refills | Status: DC

## 2019-02-25 MED ORDER — FLECAINIDE ACETATE 100 MG PO TABS: 100.0000 mg | ORAL_TABLET | Freq: Two times a day (BID) | ORAL | 3 refills | Status: DC

## 2019-02-25 MED ORDER — APIXABAN 5 MG PO TABS: 5.0000 mg | ORAL_TABLET | Freq: Two times a day (BID) | ORAL | 3 refills | Status: DC

## 2019-02-28 ENCOUNTER — Other Ambulatory Visit (INDEPENDENT_AMBULATORY_CARE_PROVIDER_SITE_OTHER): Payer: Medicare HMO

## 2019-02-28 DIAGNOSIS — I480 Paroxysmal atrial fibrillation: Secondary | ICD-10-CM | POA: Diagnosis not present

## 2019-02-28 DIAGNOSIS — I779 Disorder of arteries and arterioles, unspecified: Secondary | ICD-10-CM

## 2019-02-28 DIAGNOSIS — I6523 Occlusion and stenosis of bilateral carotid arteries: Secondary | ICD-10-CM | POA: Diagnosis not present

## 2019-02-28 DIAGNOSIS — I739 Peripheral vascular disease, unspecified: Secondary | ICD-10-CM

## 2019-04-26 ENCOUNTER — Ambulatory Visit (HOSPITAL_COMMUNITY)
Admission: RE | Admit: 2019-04-26 | Discharge: 2019-04-26 | Disposition: A | Discharge status: Home / Self Care | Payer: Medicare HMO | Source: Ambulatory Visit | Attending: Cardiology | Admitting: Cardiology

## 2019-04-26 ENCOUNTER — Other Ambulatory Visit: Payer: Self-pay

## 2019-04-26 ENCOUNTER — Other Ambulatory Visit (HOSPITAL_COMMUNITY): Payer: Self-pay | Admitting: Cardiology

## 2019-04-26 DIAGNOSIS — I480 Paroxysmal atrial fibrillation: Secondary | ICD-10-CM | POA: Diagnosis not present

## 2019-04-26 DIAGNOSIS — E7800 Pure hypercholesterolemia, unspecified: Secondary | ICD-10-CM | POA: Diagnosis not present

## 2019-04-26 DIAGNOSIS — I779 Disorder of arteries and arterioles, unspecified: Secondary | ICD-10-CM | POA: Insufficient documentation

## 2019-04-26 DIAGNOSIS — I10 Essential (primary) hypertension: Secondary | ICD-10-CM

## 2019-04-26 DIAGNOSIS — I6523 Occlusion and stenosis of bilateral carotid arteries: Secondary | ICD-10-CM

## 2019-08-24 NOTE — Progress Notes (Signed)
Virtual Visit via Video Note   This visit type was conducted due to national recommendations for restrictions regarding the COVID-19 Pandemic (e.g. social distancing) in an effort to limit this patient's exposure and mitigate transmission in our community.  Due to his co-morbid illnesses, this patient is at least at moderate risk for complications without adequate follow up.  This format is felt to be most appropriate for this patient at this time.  All issues noted in this document were discussed and addressed.  A limited physical exam was performed with this format.  Please refer to the patient's chart for his consent to telehealth for [facility].   The patient was identified using 2 identifiers.  Date:  08/25/2019   ID:  Henry Barnett, DOB 1946-05-18, MRN [id number]  Patient Location: Home Provider Location: Home  PCP:  Aletha Halim., PA-C  Cardiologist:  Emilee Market Martinique, MD  Electrophysiologist:  None   Evaluation Performed:  Follow-Up Visit  Chief Complaint:  afib  History of Present Illness:    Henry Barnett is a 74 y.o. male with history of paroxysmal atrial fibrillation and is on flecainide. His other issues include HTN, obesity, ED, HLD, carotid disease.  He had a stress Myoview study in March 2014 which was normal. Carotid dopplers in November 2019 showed 40-59% bilateral disease which is stable. This was unchanged in November 2020.   On follow up he is feeling well. He has some difficulty with his knee. Still walking 3-4 miles per day. He has infrequent episodes of Afib. In January he had an episode lasting 3 hours with rate 105. No dyspnea or chest pain. No dizziness. Tolerating medications well.    The patient does not have symptoms concerning for COVID-19 infection (fever, chills, cough, or new shortness of breath). He has been vaccinated.   Past Medical History:  Diagnosis Date  . Abnormal echocardiogram Feb 2013   biatrial enlargement. EF 45 to 50%.   .  Carotid stenosis, left    moderate Left carotid stenosis  . H/O: rheumatic fever   . Hypercholesteremia   . Hypertension   . Normal nuclear stress test Oct 2009   No ischemia with EF of 63%  . Paroxysmal A-fib (HCC)    on Flecainide; converted spontaneously and cardioverted cancelled   Past Surgical History:  Procedure Laterality Date  . CHOLECYSTECTOMY    . INGUINAL HERNIA REPAIR     Status post left inguinal hernia repair     Current Meds  Medication Sig  . apixaban (ELIQUIS) 5 MG TABS tablet Take 1 tablet (5 mg total) by mouth 2 (two) times daily.  . diphenhydramine-acetaminophen (TYLENOL PM) 25-500 MG TABS tablet Take 2 tablets by mouth at bedtime as needed.   . fish oil-omega-3 fatty acids 1000 MG capsule Take 2 g by mouth 2 (two) times daily.    . flecainide (TAMBOCOR) 100 MG tablet Take 1 tablet (100 mg total) by mouth 2 (two) times daily.  . fluticasone (FLONASE) 50 MCG/ACT nasal spray Place 1 spray into both nostrils daily as needed for allergies or rhinitis.  Marland Kitchen losartan-hydrochlorothiazide (HYZAAR) 100-12.5 MG tablet Take 1 tablet by mouth daily.  . simvastatin (ZOCOR) 40 MG tablet Take 1 tablet (40 mg total) by mouth at bedtime.     Allergies:   Ace inhibitors   Social History   Tobacco Use  . Smoking status: Former Smoker    Quit date: 01/31/1975    Years since quitting: 44.5  . Smokeless tobacco: Never  Used  Substance Use Topics  . Alcohol use: No  . Drug use: No     Family Hx: The patient's family history includes Heart disease in his mother; Stroke in his mother.  ROS:   Please see the history of present illness.    All other systems reviewed and are negative.   Prior CV studies:   The following studies were reviewed today:  None   Labs/Other Tests and Data Reviewed:    EKG:  No ECG reviewed.  Recent Labs: No results found for requested labs within last 8760 hours.   Recent Lipid Panel Lab Results  Component Value Date/Time   CHOL 136  05/10/2018 09:23 AM   TRIG 75 05/10/2018 09:23 AM   HDL 51 05/10/2018 09:23 AM   CHOLHDL 2.7 05/10/2018 09:23 AM   CHOLHDL 2.6 02/25/2016 11:49 AM   LDLCALC 70 05/10/2018 09:23 AM    Labs dated 03/07/19: glucose 113, creatinine 0.91. cholesterol 127, triglycerides 76, HDL 52, LDL 69. Otherwise CMET normal. Labs dated 05/16/19: Hgb 13.3.    Wt Readings from Last 3 Encounters:  08/25/19 241 lb (109.3 kg)  02/25/19 244 lb (110.7 kg)  07/19/18 242 lb (109.8 kg)     Objective:    Vital Signs:  BP 122/78   Pulse (!) 49   Ht 6\' 1"  (1.854 m)   Wt 241 lb (109.3 kg)   BMI 31.80 kg/m    VITAL SIGNS:  reviewed  General no distress HEENT normal Respirations unlabored.  Skin dry Neuro alert and oriented x 3. Mood normal. nonfocal.  ASSESSMENT & PLAN:    1. Paroxysmal Atrial fibrillation, well controlled on flecainide. Infrequent episodes that resolve spontaneously. On Flecainide.  VASC score is 3. Continue Eliquis.   2. Carotid arterial disease. Carotid dopplers show moderate bilateral disease. Repeat November 2020  showed 40-59% bilateral stenosis- stable. Will continue risk factor modification and repeat yearly doppler.  3. Hypertension, well controlled.  4. Hyperlipidemia. Good control on simvastatin.    COVID-19 Education: The signs and symptoms of COVID-19 were discussed with the patient and how to seek care for testing (follow up with PCP or arrange E-visit).  The importance of social distancing was discussed today.  Time:   Today, I have spent 10 minutes with the patient with telehealth technology discussing the above problems.     Medication Adjustments/Labs and Tests Ordered: Current medicines are reviewed at length with the patient today.  Concerns regarding medicines are outlined above.   Tests Ordered: No orders of the defined types were placed in this encounter.   Medication Changes: No orders of the defined types were placed in this  encounter.   Follow Up:  In Person in 6 month(s)  Signed, Amulya Quintin December 2020, MD  08/25/2019 9:00 AM    [facility]

## 2019-08-25 ENCOUNTER — Encounter: Payer: Self-pay | Admitting: Cardiology

## 2019-08-25 ENCOUNTER — Ambulatory Visit: Payer: Medicare HMO | Admitting: Cardiology

## 2019-08-25 ENCOUNTER — Telehealth (INDEPENDENT_AMBULATORY_CARE_PROVIDER_SITE_OTHER): Payer: Medicare HMO | Admitting: Cardiology

## 2019-08-25 VITALS — BP 122/78 | HR 49 | Ht 73.0 in | Wt 241.0 lb

## 2019-08-25 DIAGNOSIS — I480 Paroxysmal atrial fibrillation: Secondary | ICD-10-CM

## 2019-08-25 DIAGNOSIS — E7800 Pure hypercholesterolemia, unspecified: Secondary | ICD-10-CM

## 2019-08-25 DIAGNOSIS — I779 Disorder of arteries and arterioles, unspecified: Secondary | ICD-10-CM

## 2019-08-25 DIAGNOSIS — I10 Essential (primary) hypertension: Secondary | ICD-10-CM

## 2019-08-25 NOTE — Patient Instructions (Signed)
Medication Instructions:  Continue same medications *If you need a refill on your cardiac medications before your next appointment, please call your pharmacy*   Lab Work: None ordered    Testing/Procedures: None ordered   Follow-Up: At [facility], you and your health needs are our priority.  As part of our continuing mission to provide you with exceptional heart care, we have created designated Provider Care Teams.  These Care Teams include your primary Cardiologist (physician) and Advanced Practice Providers (APPs -  Physician Assistants and Nurse Practitioners) who all work together to provide you with the care you need, when you need it.  We recommend signing up for the patient portal called "MyChart".  Sign up information is provided on this After Visit Summary.  MyChart is used to connect with patients for Virtual Visits (Telemedicine).  Patients are able to view lab/test results, encounter notes, upcoming appointments, etc.  Non-urgent messages can be sent to your provider as well.   To learn more about what you can do with MyChart, go to [email or web address].    Your next appointment:  6 months   Call in July to schedule Sept appointment    The format for your next appointment:Office     Provider:  Dr.Jordan

## 2019-11-16 ENCOUNTER — Telehealth: Payer: Self-pay | Admitting: Cardiology

## 2019-11-16 NOTE — Telephone Encounter (Signed)
Thanks

## 2019-11-16 NOTE — Telephone Encounter (Signed)
Returned call to patient, patient states he has been having intermittent chest pain x 7-10 days, describes as tightness in his chest.    Reports this is happening multiple times a day, can happen at rest or exertion.  Last about 10 mins and resolves.    States he was golfing today and rode the cart but did not notice it worse with exertion.   Denies SOB, N/V/D, radiation.      BP this AM 138/70 HR 56.   Denies symptoms at current.    He states he did have a "dizzy spell" at church on Sunday. States he was standing about 30 mins getting ready to sing and his wife told him he looked "ashy" so he sat down and this resolved.  Has not occurred again.     Per chart review: hx of PAF on flecainide, HTN, HLD, carotid disease.    Offered appt tomorrow with APP, patient request to wait until Friday to see Dr. [place].  appt scheduled for Friday with Dr. [place] at 4 pm.       Advised if symptoms return to proceed to ER for evaluation.  Patient agreed and verbalized understanding.   Routed to MD to make aware.

## 2019-11-16 NOTE — Telephone Encounter (Signed)
New message   Pt c/o of Chest Pain: STAT if CP now or developed within 24 hours  1. Are you having CP right now? No   2. Are you experiencing any other symptoms (ex. SOB, nausea, vomiting, sweating)? Weakness   3. How long have you been experiencing CP? Patient states that he has had chest pain for a week   4. Is your CP continuous or coming and going?coming and going   5. Have you taken Nitroglycerin? No  ?

## 2019-11-17 ENCOUNTER — Ambulatory Visit: Payer: Medicare HMO | Admitting: Cardiology

## 2019-11-17 NOTE — Progress Notes (Signed)
[name] Date of Birth: 1945/07/01 Medical Record #[id number]  History of Present Illness: [name] is seen for evaluation of chest pain. He has a history of paroxysmal atrial fibrillation and is on flecainide. His other issues include HTN, obesity, ED, HLD, carotid disease.  He had a stress Myoview study in March 2014 which was normal. Carotid dopplers in November 2019 showed 40-59% bilateral disease which is stable.   Over the past 7-10 days he has been experiencing intermittent chest tightness. It lasts from seconds to a few minutes. Not really related to activity. One day when standing for a long time singing in choir he got weak and felt he might pass out. He sat down and felt better. He did play golf 2 days ago without any difficulty. BP at home is usually 120/70.   Current Outpatient Medications on File Prior to Visit  Medication Sig Dispense Refill   apixaban (ELIQUIS) 5 MG TABS tablet Take 1 tablet (5 mg total) by mouth 2 (two) times daily. 180 tablet 3   diphenhydramine-acetaminophen (TYLENOL PM) 25-500 MG TABS tablet Take 2 tablets by mouth at bedtime as needed.      fish oil-omega-3 fatty acids 1000 MG capsule Take 2 g by mouth 2 (two) times daily.       flecainide (TAMBOCOR) 100 MG tablet Take 1 tablet (100 mg total) by mouth 2 (two) times daily. 180 tablet 3   fluticasone (FLONASE) 50 MCG/ACT nasal spray Place 1 spray into both nostrils daily as needed for allergies or rhinitis.     losartan-hydrochlorothiazide (HYZAAR) 100-12.5 MG tablet Take 1 tablet by mouth daily. 90 tablet 3   simvastatin (ZOCOR) 40 MG tablet Take 1 tablet (40 mg total) by mouth at bedtime. 90 tablet 3   No current facility-administered medications on file prior to visit.    Allergies  Allergen Reactions   Ace Inhibitors Cough    Other reaction(s): Cough (ALLERGY/intolerance)    Past Medical History:  Diagnosis Date   Abnormal echocardiogram Feb 2013   biatrial enlargement. EF 45  to 50%.    Carotid stenosis, left    moderate Left carotid stenosis   H/O: rheumatic fever    Hypercholesteremia    Hypertension    Normal nuclear stress test Oct 2009   No ischemia with EF of 63%   Paroxysmal A-fib (HCC)    on Flecainide; converted spontaneously and cardioverted cancelled    Past Surgical History:  Procedure Laterality Date   CHOLECYSTECTOMY     INGUINAL HERNIA REPAIR     Status post left inguinal hernia repair    Social History   Tobacco Use  Smoking Status Former Smoker   Quit date: 01/31/1975   Years since quitting: 44.8  Smokeless Tobacco Never Used    Social History   Substance and Sexual Activity  Alcohol Use No    Family History  Problem Relation Age of Onset   Heart disease Mother    Stroke Mother     Review of Systems: The review of systems is per the HPI.  All other systems were reviewed and are negative.  Physical Exam: BP 138/64 (BP Location: Right Arm, Cuff Size: Normal)    Pulse (!) 54    Ht 6\' 1"  (1.854 m)    Wt 243 lb 3.2 oz (110.3 kg)    BMI 32.09 kg/m  GENERAL:  Well appearing, obese WM in NAD HEENT:  PERRL, EOMI, sclera are clear. Oropharynx is clear. NECK:  No jugular venous  distention, carotid upstroke brisk and symmetric, no bruits, no thyromegaly or adenopathy LUNGS:  Clear to auscultation bilaterally CHEST:  Unremarkable HEART:  RRR,  PMI not displaced or sustained,S1 and S2 within normal limits, no S3, no S4: no clicks, no rubs, no murmurs ABD:  Soft, nontender. BS +, no masses or bruits. No hepatomegaly, no splenomegaly EXT:  2 + pulses throughout, no edema, no cyanosis no clubbing SKIN:  Warm and dry.  No rashes NEURO:  Alert and oriented x 3. Cranial nerves II through XII intact. PSYCH:  Cognitively intact   LABORATORY DATA:  Lab Results  Component Value Date   WBC 6.2 04/10/2014   HGB 13.5 04/10/2014   HCT 38.7 (L) 04/10/2014   PLT 205 04/10/2014   GLUCOSE 97 05/10/2018   CHOL 136 05/10/2018    TRIG 75 05/10/2018   HDL 51 05/10/2018   LDLCALC 70 05/10/2018   ALT 17 05/10/2018   AST 19 05/10/2018   NA 137 05/10/2018   K 3.9 05/10/2018   CL 100 05/10/2018   CREATININE 0.82 05/10/2018   BUN 20 05/10/2018   CO2 22 05/10/2018   TSH 3.12 07/11/2011   INR 1.3 (H) 08/29/2011   Labs dated 03/07/19: cholesterol 127, triglycerides 76, HDL 52, LDL 69. Glucose 113. Otherwise CMET normal Dated 05/16/19: normal CBC.   Ecg today shows NSR rate 54. Occ. PVC. Otherwise normal.  I have personally reviewed and interpreted this study.  Assessment / Plan: 1. Paroxysmal Atrial fibrillation, well controlled on flecainide. No episodes in 4 years. [place] VASC score is 3. Continue Eliquis.   2. Carotid arterial disease. Carotid dopplers show moderate bilateral disease. Repeat 05/10/18 showed 40-59% bilateral stenosis. Will continue risk factor modification and repeat yearly doppler.  3. Hypertension, well controlled.  4. Hyperlipidemia. Good control on simvastatin.   5. Chest tightness. Ecg is stable. ? Anginal. Will arrange for stress Myoview.    Continue current therapy. Follow up in 6 months.

## 2019-11-18 ENCOUNTER — Encounter: Payer: Self-pay | Admitting: Cardiology

## 2019-11-18 ENCOUNTER — Ambulatory Visit: Payer: Medicare HMO | Admitting: Cardiology

## 2019-11-18 ENCOUNTER — Other Ambulatory Visit: Payer: Self-pay

## 2019-11-18 VITALS — BP 138/64 | HR 54 | Ht 73.0 in | Wt 243.2 lb

## 2019-11-18 DIAGNOSIS — I10 Essential (primary) hypertension: Secondary | ICD-10-CM | POA: Diagnosis not present

## 2019-11-18 DIAGNOSIS — I480 Paroxysmal atrial fibrillation: Secondary | ICD-10-CM

## 2019-11-18 DIAGNOSIS — I779 Disorder of arteries and arterioles, unspecified: Secondary | ICD-10-CM

## 2019-11-18 DIAGNOSIS — R072 Precordial pain: Secondary | ICD-10-CM

## 2019-11-18 DIAGNOSIS — R0789 Other chest pain: Secondary | ICD-10-CM | POA: Diagnosis not present

## 2019-11-18 DIAGNOSIS — E7800 Pure hypercholesterolemia, unspecified: Secondary | ICD-10-CM

## 2019-11-18 NOTE — Patient Instructions (Signed)
Medication Instructions:  Continue same medications *If you need a refill on your cardiac medications before your next appointment, please call your pharmacy*   Lab Work: None ordered    Testing/Procedures: Schedule Stress Myoview   Follow-Up: At [facility], you and your health needs are our priority.  As part of our continuing mission to provide you with exceptional heart care, we have created designated Provider Care Teams.  These Care Teams include your primary Cardiologist (physician) and Advanced Practice Providers (APPs -  Physician Assistants and Nurse Practitioners) who all work together to provide you with the care you need, when you need it.  We recommend signing up for the patient portal called "MyChart".  Sign up information is provided on this After Visit Summary.  MyChart is used to connect with patients for Virtual Visits (Telemedicine).  Patients are able to view lab/test results, encounter notes, upcoming appointments, etc.  Non-urgent messages can be sent to your provider as well.   To learn more about what you can do with MyChart, go to [email or web address].    Your next appointment:  Will be determined after test   The format for your next appointment: Office   Provider:  Dr.Jordan

## 2019-11-21 ENCOUNTER — Telehealth: Payer: Self-pay | Admitting: Cardiology

## 2019-11-21 ENCOUNTER — Encounter: Payer: Self-pay | Admitting: Cardiology

## 2019-11-21 NOTE — Telephone Encounter (Signed)
Spoke with patient regarding appointment for exercise myoview scheduled Tuesday 11/29/19 fat 8:00 am and COVID screening scheduled Friday  11/25/19 at [address]  Will mail information to patient and he voiced his understanding.

## 2019-11-25 ENCOUNTER — Other Ambulatory Visit (HOSPITAL_COMMUNITY): Payer: Medicare HMO

## 2019-11-28 ENCOUNTER — Other Ambulatory Visit (HOSPITAL_COMMUNITY)
Admission: RE | Admit: 2019-11-28 | Discharge: 2019-11-28 | Disposition: A | Discharge status: Home / Self Care | Payer: Medicare HMO | Source: Ambulatory Visit | Attending: Cardiology | Admitting: Cardiology

## 2019-11-28 DIAGNOSIS — Z01812 Encounter for preprocedural laboratory examination: Secondary | ICD-10-CM | POA: Diagnosis present

## 2019-11-28 DIAGNOSIS — Z20822 Contact with and (suspected) exposure to covid-19: Secondary | ICD-10-CM | POA: Insufficient documentation

## 2019-11-28 LAB — SARS CORONAVIRUS 2 (TAT 6-24 HRS): SARS Coronavirus 2: NEGATIVE

## 2019-11-29 ENCOUNTER — Encounter (HOSPITAL_COMMUNITY): Payer: Medicare HMO

## 2019-11-30 ENCOUNTER — Telehealth (HOSPITAL_COMMUNITY): Payer: Self-pay

## 2019-11-30 NOTE — Telephone Encounter (Signed)
Encounter complete. 

## 2019-12-02 ENCOUNTER — Other Ambulatory Visit: Payer: Self-pay

## 2019-12-02 ENCOUNTER — Ambulatory Visit (HOSPITAL_COMMUNITY)
Admission: RE | Admit: 2019-12-02 | Discharge: 2019-12-02 | Disposition: A | Discharge status: Home / Self Care | Payer: Medicare HMO | Source: Ambulatory Visit | Attending: Cardiovascular Disease | Admitting: Cardiovascular Disease

## 2019-12-02 DIAGNOSIS — R072 Precordial pain: Secondary | ICD-10-CM | POA: Diagnosis present

## 2019-12-02 LAB — MYOCARDIAL PERFUSION IMAGING
LV dias vol: 147 mL (ref 62–150)
LV sys vol: 69 mL
Peak HR: 71 {beats}/min
Rest HR: 57 {beats}/min
SDS: 0
SRS: 6
SSS: 6
TID: 1.13

## 2019-12-02 MED ORDER — TECHNETIUM TC 99M TETROFOSMIN IV KIT
10.7000 | PACK | Freq: Once | INTRAVENOUS | Status: AC | PRN
  Administered 2019-12-02: 10.7 via INTRAVENOUS
  Filled 2019-12-02: qty 11

## 2019-12-02 MED ORDER — TECHNETIUM TC 99M TETROFOSMIN IV KIT
31.4000 | PACK | Freq: Once | INTRAVENOUS | Status: AC | PRN
  Administered 2019-12-02: 31.4 via INTRAVENOUS
  Filled 2019-12-02: qty 32

## 2019-12-02 MED ORDER — REGADENOSON 0.4 MG/5ML IV SOLN
0.4000 mg | Freq: Once | INTRAVENOUS | Status: AC
  Administered 2019-12-02: 0.4 mg via INTRAVENOUS

## 2020-02-22 ENCOUNTER — Other Ambulatory Visit: Payer: Self-pay | Admitting: Cardiology

## 2020-03-05 ENCOUNTER — Other Ambulatory Visit: Payer: Self-pay | Admitting: Cardiology

## 2020-03-06 NOTE — Telephone Encounter (Signed)
Dx = Atrial fibrillation Last OV 11-18-19 Scr = 0.96 on 02/2020 @ [facility] 74yo Male 110kg

## 2020-03-15 ENCOUNTER — Other Ambulatory Visit: Payer: Self-pay | Admitting: Cardiology

## 2020-03-15 DIAGNOSIS — I480 Paroxysmal atrial fibrillation: Secondary | ICD-10-CM

## 2020-04-26 ENCOUNTER — Other Ambulatory Visit (HOSPITAL_COMMUNITY): Payer: Self-pay | Admitting: Cardiology

## 2020-04-26 ENCOUNTER — Other Ambulatory Visit: Payer: Self-pay

## 2020-04-26 ENCOUNTER — Ambulatory Visit (HOSPITAL_COMMUNITY)
Admission: RE | Admit: 2020-04-26 | Discharge: 2020-04-26 | Disposition: A | Discharge status: Home / Self Care | Payer: Medicare HMO | Source: Ambulatory Visit | Attending: Cardiology | Admitting: Cardiology

## 2020-04-26 DIAGNOSIS — I6522 Occlusion and stenosis of left carotid artery: Secondary | ICD-10-CM

## 2020-04-26 DIAGNOSIS — I6523 Occlusion and stenosis of bilateral carotid arteries: Secondary | ICD-10-CM | POA: Insufficient documentation

## 2020-05-01 ENCOUNTER — Other Ambulatory Visit: Payer: Self-pay

## 2020-05-01 DIAGNOSIS — I6523 Occlusion and stenosis of bilateral carotid arteries: Secondary | ICD-10-CM

## 2020-05-01 DIAGNOSIS — I779 Disorder of arteries and arterioles, unspecified: Secondary | ICD-10-CM

## 2020-05-05 NOTE — Progress Notes (Signed)
Henry Barnett Date of Birth: 1946/01/08 Medical Record #[id number]  History of Present Illness: Mr. Sposito is seen for evaluation of chest pain. He has a history of paroxysmal atrial fibrillation and is on flecainide. His other issues include HTN, obesity, ED, HLD, carotid disease.  He had a stress Myoview study in March 2014 which was normal. Carotid dopplers in November 2019 showed 40-59% bilateral disease which is stable.   When seen in June  he was experiencing intermittent chest tightness. Myoview study was done and was normal. Repeat carotid dopplers in November showed bilateral 40-59% stenosis- unchanged.   On follow up today he feels very well. Walking 4 miles a day and playing some golf. 2 months ago had some vertigo while lying in bed. Felt a little off a couple of days but this resolved. No chest pain or dyspnea.    Current Outpatient Medications on File Prior to Visit  Medication Sig Dispense Refill   diphenhydramine-acetaminophen (TYLENOL PM) 25-500 MG TABS tablet Take 2 tablets by mouth at bedtime as needed.      ELIQUIS 5 MG TABS tablet Take 1 tablet by mouth twice daily 60 tablet 5   fish oil-omega-3 fatty acids 1000 MG capsule Take 2 g by mouth 2 (two) times daily.       flecainide (TAMBOCOR) 100 MG tablet Take 1 tablet by mouth twice daily 180 tablet 0   fluticasone (FLONASE) 50 MCG/ACT nasal spray Place 1 spray into both nostrils daily as needed for allergies or rhinitis.     losartan-hydrochlorothiazide (HYZAAR) 100-12.5 MG tablet Take 1 tablet by mouth once daily 90 tablet 3   simvastatin (ZOCOR) 40 MG tablet TAKE 1 TABLET BY MOUTH AT BEDTIME 90 tablet 3   No current facility-administered medications on file prior to visit.    Allergies  Allergen Reactions   Ace Inhibitors Cough    Other reaction(s): Cough (ALLERGY/intolerance)    Past Medical History:  Diagnosis Date   Abnormal echocardiogram Feb 2013   biatrial enlargement. EF 45 to 50%.     Carotid stenosis, left    moderate Left carotid stenosis   H/O: rheumatic fever    Hypercholesteremia    Hypertension    Normal nuclear stress test Oct 2009   No ischemia with EF of 63%   Paroxysmal A-fib (HCC)    on Flecainide; converted spontaneously and cardioverted cancelled    Past Surgical History:  Procedure Laterality Date   CHOLECYSTECTOMY     INGUINAL HERNIA REPAIR     Status post left inguinal hernia repair    Social History   Tobacco Use  Smoking Status Former Smoker   Quit date: 01/31/1975   Years since quitting: 45.2  Smokeless Tobacco Never Used    Social History   Substance and Sexual Activity  Alcohol Use No    Family History  Problem Relation Age of Onset   Heart disease Mother    Stroke Mother     Review of Systems: The review of systems is per the HPI.  All other systems were reviewed and are negative.  Physical Exam: BP (!) 146/72    Pulse 60    Ht 6' (1.829 m)    Wt 244 lb 3.2 oz (110.8 kg)    SpO2 96%    BMI 33.12 kg/m  GENERAL:  Well appearing, obese WM in NAD HEENT:  PERRL, EOMI, sclera are clear. Oropharynx is clear. NECK:  No jugular venous distention, carotid upstroke brisk and symmetric, no bruits, no  thyromegaly or adenopathy LUNGS:  Clear to auscultation bilaterally CHEST:  Unremarkable HEART:  RRR,  PMI not displaced or sustained,S1 and S2 within normal limits, no S3, no S4: no clicks, no rubs, no murmurs ABD:  Soft, nontender. BS +, no masses or bruits. No hepatomegaly, no splenomegaly EXT:  2 + pulses throughout, no edema, no cyanosis no clubbing SKIN:  Warm and dry.  No rashes NEURO:  Alert and oriented x 3. Cranial nerves II through XII intact. PSYCH:  Cognitively intact   LABORATORY DATA:  Lab Results  Component Value Date   WBC 6.2 04/10/2014   HGB 13.5 04/10/2014   HCT 38.7 (L) 04/10/2014   PLT 205 04/10/2014   GLUCOSE 97 05/10/2018   CHOL 136 05/10/2018   TRIG 75 05/10/2018   HDL 51 05/10/2018    LDLCALC 70 05/10/2018   ALT 17 05/10/2018   AST 19 05/10/2018   NA 137 05/10/2018   K 3.9 05/10/2018   CL 100 05/10/2018   CREATININE 0.82 05/10/2018   BUN 20 05/10/2018   CO2 22 05/10/2018   TSH 3.12 07/11/2011   INR 1.3 (H) 08/29/2011   Labs dated 03/07/19: cholesterol 127, triglycerides 76, HDL 52, LDL 69. Glucose 113. Otherwise CMET normal Dated 05/16/19: normal CBC.  Dated 03/05/20: A1c 5.2%. glucose 107, otherwise CMET normal. Cholesterol 152, triglycerides 112, HDL 53, LDL 76. CBC normal.  Ecg not done today.  Myoview 12/02/19: Study Highlights    The left ventricular ejection fraction is mildly decreased (45-54%).  Nuclear stress EF: 53%.  There was no ST segment deviation noted during stress.  The study is normal.  This is a low risk study.   Normal resting and stress perfusion. No ischemia or infarction EF 53%     Assessment / Plan: 1. Paroxysmal Atrial fibrillation, well controlled on flecainide. No episodes in over 4 years. [place] VASC score is 3. Continue Eliquis.   2. Carotid arterial disease. Carotid dopplers show moderate bilateral disease. Repeat this month showed 40-59% bilateral stenosis unchanged from prior. Will continue risk factor modification and repeat yearly doppler.  3. Hypertension, well controlled.  4. Hyperlipidemia. Good control on simvastatin.   5. Chest tightness. Resolved. Myoview was normal.     Continue current therapy. Follow up in 6 months.

## 2020-05-08 ENCOUNTER — Ambulatory Visit: Payer: Medicare HMO | Admitting: Cardiology

## 2020-05-08 ENCOUNTER — Other Ambulatory Visit: Payer: Self-pay

## 2020-05-08 ENCOUNTER — Encounter: Payer: Self-pay | Admitting: Cardiology

## 2020-05-08 VITALS — BP 146/72 | HR 60 | Ht 72.0 in | Wt 244.2 lb

## 2020-05-08 DIAGNOSIS — I480 Paroxysmal atrial fibrillation: Secondary | ICD-10-CM | POA: Diagnosis not present

## 2020-05-08 DIAGNOSIS — I6523 Occlusion and stenosis of bilateral carotid arteries: Secondary | ICD-10-CM

## 2020-05-08 DIAGNOSIS — I10 Essential (primary) hypertension: Secondary | ICD-10-CM | POA: Diagnosis not present

## 2020-05-14 ENCOUNTER — Other Ambulatory Visit: Payer: Medicare HMO

## 2020-05-14 DIAGNOSIS — Z20822 Contact with and (suspected) exposure to covid-19: Secondary | ICD-10-CM

## 2020-05-16 LAB — SARS-COV-2, NAA 2 DAY TAT

## 2020-05-16 LAB — NOVEL CORONAVIRUS, NAA: SARS-CoV-2, NAA: NOT DETECTED

## 2020-05-17 ENCOUNTER — Other Ambulatory Visit: Payer: Self-pay

## 2020-05-17 ENCOUNTER — Other Ambulatory Visit: Payer: Medicare HMO

## 2020-05-17 DIAGNOSIS — Z20822 Contact with and (suspected) exposure to covid-19: Secondary | ICD-10-CM

## 2020-05-19 LAB — SPECIMEN STATUS REPORT

## 2020-05-19 LAB — NOVEL CORONAVIRUS, NAA: SARS-CoV-2, NAA: NOT DETECTED

## 2020-05-19 LAB — SARS-COV-2, NAA 2 DAY TAT

## 2020-06-05 ENCOUNTER — Other Ambulatory Visit: Payer: Self-pay | Admitting: Cardiology

## 2020-06-20 ENCOUNTER — Other Ambulatory Visit: Payer: Self-pay

## 2020-06-20 ENCOUNTER — Ambulatory Visit: Payer: Medicare HMO

## 2020-09-10 ENCOUNTER — Other Ambulatory Visit: Payer: Self-pay | Admitting: Cardiology

## 2020-09-10 NOTE — Telephone Encounter (Signed)
15m, 110.8kg, scr 0.96(03/05/20), lovw/jordan (05/08/20)

## 2020-09-10 NOTE — Telephone Encounter (Signed)
Prescription refill request for Eliquis received. Indication: atrial fibrillation Last office visit:11/21  [place] Scr: needs labs Age: 75 Weight:110.8 kg  Prescription refilled

## 2020-11-02 NOTE — Progress Notes (Signed)
Henry Barnett Date of Birth: 07/31/1945 Medical Record #[id number]  History of Present Illness: Henry Barnett is seen for evaluation of chest pain. He has a history of paroxysmal atrial fibrillation and is on flecainide. Last Afib was in Jan 2013. His other issues include HTN, obesity, ED, HLD, carotid disease.  He had a stress Myoview study in March 2014 which was normal. Carotid dopplers in November 2019 showed 40-59% bilateral disease which is stable.   When seen in June  he was experiencing intermittent chest tightness. Myoview study was done and was normal. Repeat carotid dopplers in November showed bilateral 40-59% stenosis- unchanged.   On follow up today he feels very well. Walking about 26-40 miles per week. Denies any palpitations, dizziness, chest tightness or SOB. Just feels like he tires out easier.    Current Outpatient Medications on File Prior to Visit  Medication Sig Dispense Refill  . diphenhydramine-acetaminophen (TYLENOL PM) 25-500 MG TABS tablet Take 2 tablets by mouth at bedtime as needed.     Marland Kitchen ELIQUIS 5 MG TABS tablet Take 1 tablet by mouth twice daily 180 tablet 1  . fish oil-omega-3 fatty acids 1000 MG capsule Take 2 g by mouth 2 (two) times daily.    . flecainide (TAMBOCOR) 100 MG tablet Take 1 tablet by mouth twice daily 180 tablet 0  . fluticasone (FLONASE) 50 MCG/ACT nasal spray Place 1 spray into both nostrils daily as needed for allergies or rhinitis.    Marland Kitchen losartan-hydrochlorothiazide (HYZAAR) 100-12.5 MG tablet Take 1 tablet by mouth once daily 90 tablet 3  . simvastatin (ZOCOR) 40 MG tablet TAKE 1 TABLET BY MOUTH AT BEDTIME 90 tablet 3   No current facility-administered medications on file prior to visit.    Allergies  Allergen Reactions  . Ace Inhibitors Cough    Other reaction(s): Cough (ALLERGY/intolerance)    Past Medical History:  Diagnosis Date  . Abnormal echocardiogram Feb 2013   biatrial enlargement. EF 45 to 50%.   . Carotid stenosis,  left    moderate Left carotid stenosis  . H/O: rheumatic fever   . Hypercholesteremia   . Hypertension   . Normal nuclear stress test Oct 2009   No ischemia with EF of 63%  . Paroxysmal A-fib (HCC)    on Flecainide; converted spontaneously and cardioverted cancelled    Past Surgical History:  Procedure Laterality Date  . CHOLECYSTECTOMY    . INGUINAL HERNIA REPAIR     Status post left inguinal hernia repair    Social History   Tobacco Use  Smoking Status Former Smoker  . Quit date: 01/31/1975  . Years since quitting: 45.7  Smokeless Tobacco Never Used    Social History   Substance and Sexual Activity  Alcohol Use No    Family History  Problem Relation Age of Onset  . Heart disease Mother   . Stroke Mother     Review of Systems: The review of systems is per the HPI.  All other systems were reviewed and are negative.  Physical Exam: BP 126/66 (BP Location: Left Arm, Patient Position: Sitting, Cuff Size: Normal)   Pulse (!) 48   Ht 6\' 1"  (1.854 m)   Wt 247 lb (112 kg)   BMI 32.59 kg/m  GENERAL:  Well appearing, obese WM in NAD HEENT:  PERRL, EOMI, sclera are clear. Oropharynx is clear. NECK:  No jugular venous distention, carotid upstroke brisk and symmetric, no bruits, no thyromegaly or adenopathy LUNGS:  Clear to auscultation bilaterally CHEST:  Unremarkable HEART:  RRR,  PMI not displaced or sustained,S1 and S2 within normal limits, no S3, no S4: no clicks, no rubs, no murmurs ABD:  Soft, nontender. BS +, no masses or bruits. No hepatomegaly, no splenomegaly EXT:  2 + pulses throughout, no edema, no cyanosis no clubbing SKIN:  Warm and dry.  No rashes NEURO:  Alert and oriented x 3. Cranial nerves II through XII intact. PSYCH:  Cognitively intact   LABORATORY DATA:  Lab Results  Component Value Date   WBC 6.2 04/10/2014   HGB 13.5 04/10/2014   HCT 38.7 (L) 04/10/2014   PLT 205 04/10/2014   GLUCOSE 97 05/10/2018   CHOL 136 05/10/2018   TRIG 75  05/10/2018   HDL 51 05/10/2018   LDLCALC 70 05/10/2018   ALT 17 05/10/2018   AST 19 05/10/2018   NA 137 05/10/2018   K 3.9 05/10/2018   CL 100 05/10/2018   CREATININE 0.82 05/10/2018   BUN 20 05/10/2018   CO2 22 05/10/2018   TSH 3.12 07/11/2011   INR 1.3 (H) 08/29/2011   Labs dated 03/07/19: cholesterol 127, triglycerides 76, HDL 52, LDL 69. Glucose 113. Otherwise CMET normal Dated 05/16/19: normal CBC.  Dated 03/05/20: A1c 5.2%. glucose 107, otherwise CMET normal. Cholesterol 152, triglycerides 112, HDL 53, LDL 76. CBC normal.  Ecg  done today. Sinus brady rate 48. No acute change. I have personally reviewed and interpreted this study.   Myoview 12/02/19: Study Highlights    The left ventricular ejection fraction is mildly decreased (45-54%).  Nuclear stress EF: 53%.  There was no ST segment deviation noted during stress.  The study is normal.  This is a low risk study.   Normal resting and stress perfusion. No ischemia or infarction EF 53%     Assessment / Plan: 1. Paroxysmal Atrial fibrillation, well controlled on flecainide. No episodes in 9 years.  [place] VASC score is 3. Continue Eliquis.   2. Carotid arterial disease. Carotid dopplers show moderate bilateral disease. Repeat Nov.  showed 40-59% bilateral stenosis unchanged from prior. Will continue risk factor modification and repeat yearly doppler.  3. Hypertension, well controlled.  4. Hyperlipidemia. Good control on simvastatin.   5. Chest tightness. Resolved. Myoview was normal.     Continue current therapy. Follow up in 6 months.

## 2020-11-05 ENCOUNTER — Ambulatory Visit: Payer: Medicare HMO | Admitting: Cardiology

## 2020-11-05 ENCOUNTER — Other Ambulatory Visit: Payer: Self-pay

## 2020-11-05 ENCOUNTER — Encounter: Payer: Self-pay | Admitting: Cardiology

## 2020-11-05 VITALS — BP 126/66 | HR 48 | Ht 73.0 in | Wt 247.0 lb

## 2020-11-05 DIAGNOSIS — I10 Essential (primary) hypertension: Secondary | ICD-10-CM

## 2020-11-05 DIAGNOSIS — I779 Disorder of arteries and arterioles, unspecified: Secondary | ICD-10-CM | POA: Diagnosis not present

## 2020-11-05 DIAGNOSIS — E7800 Pure hypercholesterolemia, unspecified: Secondary | ICD-10-CM | POA: Diagnosis not present

## 2020-11-05 DIAGNOSIS — I480 Paroxysmal atrial fibrillation: Secondary | ICD-10-CM

## 2020-11-05 NOTE — Patient Instructions (Signed)
Medication Instructions:  Continue same medications *If you need a refill on your cardiac medications before your next appointment, please call your pharmacy*   Lab Work: None ordered   Testing/Procedures: Carotid dopplers in 6 months 11/22   Follow-Up: At [facility], you and your health needs are our priority.  As part of our continuing mission to provide you with exceptional heart care, we have created designated Provider Care Teams.  These Care Teams include your primary Cardiologist (physician) and Advanced Practice Providers (APPs -  Physician Assistants and Nurse Practitioners) who all work together to provide you with the care you need, when you need it.  We recommend signing up for the patient portal called "MyChart".  Sign up information is provided on this After Visit Summary.  MyChart is used to connect with patients for Virtual Visits (Telemedicine).  Patients are able to view lab/test results, encounter notes, upcoming appointments, etc.  Non-urgent messages can be sent to your provider as well.   To learn more about what you can do with MyChart, go to [email or web address].    Your next appointment:  6 months   Call in August to schedule Nov appointment    The format for your next appointment:  Office    Provider:  Dr.Jordan

## 2020-12-18 ENCOUNTER — Telehealth: Payer: Self-pay | Admitting: Cardiology

## 2020-12-18 NOTE — Telephone Encounter (Signed)
*  STAT* If patient is at the pharmacy, call can be transferred to refill team.   1. Which medications need to be refilled? (please list name of each medication and dose if known) flecainide (TAMBOCOR) 100 MG tabletflecainide (TAMBOCOR) 100 MG tablet  2. Which pharmacy/location (including street and city if local pharmacy) is medication to be sent to? [facility] [address] [place] - [address]  3. Do they need a 30 day or 90 day supply?90 day supply   PT is out of this medication

## 2020-12-19 ENCOUNTER — Telehealth: Payer: Self-pay | Admitting: Cardiology

## 2020-12-19 MED ORDER — FLECAINIDE ACETATE 100 MG PO TABS: 100.0000 mg | ORAL_TABLET | Freq: Two times a day (BID) | ORAL | 3 refills | Status: DC

## 2020-12-19 NOTE — Telephone Encounter (Signed)
*  STAT* If patient is at the pharmacy, call can be transferred to refill team.   1. Which medications need to be refilled? (please list name of each medication and dose if known) flecainide (TAMBOCOR) 100 MG tablet  2. Which pharmacy/location (including street and city if local pharmacy) is medication to be sent to? [facility] [address], [place] - [address]  3. Do they need a 30 day or 90 day supply? 90ds

## 2021-01-29 ENCOUNTER — Other Ambulatory Visit: Payer: Self-pay | Admitting: Cardiology

## 2021-01-30 NOTE — Telephone Encounter (Signed)
Rx(s) sent to pharmacy electronically.  

## 2021-03-06 ENCOUNTER — Other Ambulatory Visit: Payer: Self-pay | Admitting: Cardiology

## 2021-03-06 DIAGNOSIS — I480 Paroxysmal atrial fibrillation: Secondary | ICD-10-CM

## 2021-03-06 NOTE — Telephone Encounter (Signed)
Prescription refill request for Eliquis received. Indication:atrial fib Last office visit:5/22 BHA:LPFXT labs Age: 75 Weight:112 kg  Prescription refilled

## 2021-03-07 ENCOUNTER — Other Ambulatory Visit: Payer: Self-pay | Admitting: Gastroenterology

## 2021-03-07 DIAGNOSIS — R634 Abnormal weight loss: Secondary | ICD-10-CM

## 2021-03-07 DIAGNOSIS — R109 Unspecified abdominal pain: Secondary | ICD-10-CM

## 2021-03-11 ENCOUNTER — Other Ambulatory Visit: Payer: Self-pay | Admitting: Cardiology

## 2021-03-11 NOTE — Telephone Encounter (Signed)
Labs only 6 days overdue from the 1 year mark. Pt on correct dose based on age and weight. Will refill, added note to Jan appt to recheck labs at that time

## 2021-03-11 NOTE — Telephone Encounter (Signed)
LABS NEEDED ROUTING TO PHARMD POOL FOR ORDERS  Prescription refill request for Eliquis received. Indication:afib Last office visit:jordan 11/05/20 Scr:0.96 03/05/20 OVERDUE  Age: 6m Weight:112kg

## 2021-03-22 ENCOUNTER — Other Ambulatory Visit: Payer: Self-pay

## 2021-03-22 ENCOUNTER — Ambulatory Visit
Admission: RE | Admit: 2021-03-22 | Discharge: 2021-03-22 | Disposition: A | Discharge status: Home / Self Care | Payer: Medicare HMO | Source: Ambulatory Visit | Attending: Gastroenterology | Admitting: Gastroenterology

## 2021-03-22 DIAGNOSIS — R634 Abnormal weight loss: Secondary | ICD-10-CM

## 2021-03-22 DIAGNOSIS — R109 Unspecified abdominal pain: Secondary | ICD-10-CM

## 2021-03-22 MED ORDER — IOPAMIDOL (ISOVUE-300) INJECTION 61%
100.0000 mL | Freq: Once | INTRAVENOUS | Status: AC | PRN
  Administered 2021-03-22: 100 mL via INTRAVENOUS

## 2021-05-06 ENCOUNTER — Other Ambulatory Visit: Payer: Self-pay

## 2021-05-06 ENCOUNTER — Ambulatory Visit (HOSPITAL_COMMUNITY)
Admission: RE | Admit: 2021-05-06 | Discharge: 2021-05-06 | Disposition: A | Discharge status: Home / Self Care | Payer: Medicare HMO | Source: Ambulatory Visit | Attending: Cardiology | Admitting: Cardiology

## 2021-05-06 DIAGNOSIS — I779 Disorder of arteries and arterioles, unspecified: Secondary | ICD-10-CM

## 2021-05-06 DIAGNOSIS — I6523 Occlusion and stenosis of bilateral carotid arteries: Secondary | ICD-10-CM | POA: Diagnosis not present

## 2021-05-06 DIAGNOSIS — I480 Paroxysmal atrial fibrillation: Secondary | ICD-10-CM | POA: Insufficient documentation

## 2021-05-06 NOTE — Progress Notes (Signed)
vas 

## 2021-05-31 NOTE — Progress Notes (Signed)
Henry Barnett Date of Birth: April 15, 1946 Medical Record #[id number]  History of Present Illness: Henry Barnett is seen for evaluation of chest pain. He has a history of paroxysmal atrial fibrillation and is on flecainide. Last Afib was in Jan 2013. His other issues include HTN, obesity, ED, HLD, carotid disease.  He had a stress Myoview study in March 2014 which was normal. Carotid dopplers in November 2019 showed 40-59% bilateral disease which is stable.   When seen in June  he was experiencing intermittent chest tightness. Myoview study was done and was normal. Repeat carotid dopplers in November showed bilateral 40-59% stenosis- unchanged.   He notes that in August he had Afib. No clear triggers. HR around 110. Lasted about 4 hours then resolved. This past weekend was singing in church choir and he became very hot and lightheaded. Sweaty. Sat down and once he got his robes off and drank some water symptoms went away. EMS was called. Ecg showed NSR rate 60 and no acute change. BP was OK. No recurrence. He is still active walking 3 miles a day.    Current Outpatient Medications on File Prior to Visit  Medication Sig Dispense Refill   diphenhydramine-acetaminophen (TYLENOL PM) 25-500 MG TABS tablet Take 2 tablets by mouth at bedtime as needed.      ELIQUIS 5 MG TABS tablet Take 1 tablet by mouth twice daily 180 tablet 0   fish oil-omega-3 fatty acids 1000 MG capsule Take 2 g by mouth 2 (two) times daily.     flecainide (TAMBOCOR) 100 MG tablet Take 1 tablet (100 mg total) by mouth 2 (two) times daily. 180 tablet 3   fluticasone (FLONASE) 50 MCG/ACT nasal spray Place 1 spray into both nostrils daily as needed for allergies or rhinitis.     losartan-hydrochlorothiazide (HYZAAR) 100-12.5 MG tablet Take 1 tablet by mouth once daily 90 tablet 1   simvastatin (ZOCOR) 40 MG tablet TAKE 1 TABLET BY MOUTH AT BEDTIME 90 tablet 2   No current facility-administered medications on file prior to visit.     Allergies  Allergen Reactions   Ace Inhibitors Cough    Other reaction(s): Cough (ALLERGY/intolerance)    Past Medical History:  Diagnosis Date   Abnormal echocardiogram Feb 2013   biatrial enlargement. EF 45 to 50%.    Carotid stenosis, left    moderate Left carotid stenosis   H/O: rheumatic fever    Hypercholesteremia    Hypertension    Normal nuclear stress test Oct 2009   No ischemia with EF of 63%   Paroxysmal A-fib (HCC)    on Flecainide; converted spontaneously and cardioverted cancelled    Past Surgical History:  Procedure Laterality Date   CHOLECYSTECTOMY     INGUINAL HERNIA REPAIR     Status post left inguinal hernia repair    Social History   Tobacco Use  Smoking Status Former   Types: Cigarettes   Quit date: 01/31/1975   Years since quitting: 46.3  Smokeless Tobacco Never    Social History   Substance and Sexual Activity  Alcohol Use No    Family History  Problem Relation Age of Onset   Heart disease Mother    Stroke Mother     Review of Systems: The review of systems is per the HPI.  All other systems were reviewed and are negative.  Physical Exam: BP 117/68    Pulse (!) 55    Ht 6\' 1"  (1.854 m)    Wt 236 lb (107  kg)    SpO2 97%    BMI 31.14 kg/m  GENERAL:  Well appearing, obese WM in NAD HEENT:  PERRL, EOMI, sclera are clear. Oropharynx is clear. NECK:  No jugular venous distention, carotid upstroke brisk and symmetric, no bruits, no thyromegaly or adenopathy LUNGS:  Clear to auscultation bilaterally CHEST:  Unremarkable HEART:  RRR,  PMI not displaced or sustained,S1 and S2 within normal limits, no S3, no S4: no clicks, no rubs, no murmurs ABD:  Soft, nontender. BS +, no masses or bruits. No hepatomegaly, no splenomegaly EXT:  2 + pulses throughout, no edema, no cyanosis no clubbing SKIN:  Warm and dry.  No rashes NEURO:  Alert and oriented x 3. Cranial nerves II through XII intact. PSYCH:  Cognitively intact   LABORATORY  DATA:  Lab Results  Component Value Date   WBC 6.2 04/10/2014   HGB 13.5 04/10/2014   HCT 38.7 (L) 04/10/2014   PLT 205 04/10/2014   GLUCOSE 97 05/10/2018   CHOL 136 05/10/2018   TRIG 75 05/10/2018   HDL 51 05/10/2018   LDLCALC 70 05/10/2018   ALT 17 05/10/2018   AST 19 05/10/2018   NA 137 05/10/2018   K 3.9 05/10/2018   CL 100 05/10/2018   CREATININE 0.82 05/10/2018   BUN 20 05/10/2018   CO2 22 05/10/2018   TSH 3.12 07/11/2011   INR 1.3 (H) 08/29/2011   Labs dated 03/07/19: cholesterol 127, triglycerides 76, HDL 52, LDL 69. Glucose 113. Otherwise CMET normal Dated 05/16/19: normal CBC.  Dated 03/05/20: A1c 5.2%. glucose 107, otherwise CMET normal. Cholesterol 152, triglycerides 112, HDL 53, LDL 76. CBC normal. Dated 03/12/21: Hgb 13.3. cholesterol 151, triglycerides 91, HDL 56, LDL 75. CMET and CBC otherwise normal.    Myoview 12/02/19: Study Highlights    The left ventricular ejection fraction is mildly decreased (45-54%). Nuclear stress EF: 53%. There was no ST segment deviation noted during stress. The study is normal. This is a low risk study.   Normal resting and stress perfusion. No ischemia or infarction EF 53%      Assessment / Plan: 1. Paroxysmal Atrial fibrillation, well controlled on flecainide. Only one episode in 10 years.  [place] VASC score is 3. Continue Eliquis and Tambecor.   2. Carotid arterial disease. Carotid dopplers show moderate bilateral disease. Repeat Nov.  showed 40-59% bilateral stenosis unchanged from prior. Will continue risk factor modification and repeat yearly doppler.  3. Hypertension, well controlled.  4. Hyperlipidemia. Good control on simvastatin.   5. Chest tightness. Resolved. Myoview was normal.    6. Vagal episode. Maintain good hydration.   Continue current therapy. Follow up in 6 months.

## 2021-06-04 ENCOUNTER — Ambulatory Visit: Payer: Medicare HMO | Admitting: Cardiology

## 2021-06-04 ENCOUNTER — Other Ambulatory Visit: Payer: Self-pay

## 2021-06-04 ENCOUNTER — Encounter: Payer: Self-pay | Admitting: Cardiology

## 2021-06-04 VITALS — BP 117/68 | HR 55 | Ht 73.0 in | Wt 236.0 lb

## 2021-06-04 DIAGNOSIS — I480 Paroxysmal atrial fibrillation: Secondary | ICD-10-CM

## 2021-06-04 DIAGNOSIS — E7800 Pure hypercholesterolemia, unspecified: Secondary | ICD-10-CM | POA: Diagnosis not present

## 2021-06-04 DIAGNOSIS — I10 Essential (primary) hypertension: Secondary | ICD-10-CM

## 2021-06-04 DIAGNOSIS — I779 Disorder of arteries and arterioles, unspecified: Secondary | ICD-10-CM

## 2021-06-04 NOTE — Patient Instructions (Signed)
Medication Instructions:  NO CHANGES  *If you need a refill on your cardiac medications before your next appointment, please call your pharmacy*   Lab Work:  NOT NEEDED   Testing/Procedures:  NOT NEEDED  Follow-Up: At [facility], you and your health needs are our priority.  As part of our continuing mission to provide you with exceptional heart care, we have created designated Provider Care Teams.  These Care Teams include your primary Cardiologist (physician) and Advanced Practice Providers (APPs -  Physician Assistants and Nurse Practitioners) who all work together to provide you with the care you need, when you need it.     Your next appointment:   6 month(s)  The format for your next appointment:   In Person  Provider:   Peter Jordan, MD     

## 2021-06-19 ENCOUNTER — Ambulatory Visit: Payer: Medicare HMO | Admitting: Cardiology

## 2021-07-15 ENCOUNTER — Other Ambulatory Visit: Payer: Self-pay | Admitting: Cardiology

## 2021-07-16 NOTE — Telephone Encounter (Signed)
Prescription refill request for Eliquis received. Indication: afib  Last office visit: [place], 08/05/2020 Scr: 0.85, 03/12/2021 Age: 76 yo  Weight: 107 kg   Refill sent.

## 2021-09-02 ENCOUNTER — Other Ambulatory Visit: Payer: Self-pay | Admitting: Cardiology

## 2021-09-02 DIAGNOSIS — I480 Paroxysmal atrial fibrillation: Secondary | ICD-10-CM

## 2021-11-22 NOTE — Progress Notes (Unsigned)
Henry Barnett Date of Birth: Feb 01, 1946 Medical Record [id number]  History of Present Illness: Henry Barnett is seen for evaluation of chest pain. He has a history of paroxysmal atrial fibrillation and is on flecainide. Last Afib was in Jan 2013. His other issues include HTN, obesity, ED, HLD, carotid disease.  He had a stress Myoview study in March 2014 which was normal. Carotid dopplers in November 2019 showed 40-59% bilateral disease which is stable.   When seen in June  he was experiencing intermittent chest tightness. Myoview study was done and was normal. Repeat carotid dopplers in November showed bilateral 40-59% stenosis- unchanged.   He notes that in August he had Afib. No clear triggers. HR around 110. Lasted about 4 hours then resolved. This past weekend was singing in church choir and he became very hot and lightheaded. Sweaty. Sat down and once he got his robes off and drank some water symptoms went away. EMS was called. Ecg showed NSR rate 60 and no acute change. BP was OK. No recurrence. He is still active walking 3 miles a day.    Current Outpatient Medications on File Prior to Visit  Medication Sig Dispense Refill   apixaban (ELIQUIS) 5 MG TABS tablet Take 1 tablet by mouth twice daily 180 tablet 1   diphenhydramine-acetaminophen (TYLENOL PM) 25-500 MG TABS tablet Take 2 tablets by mouth at bedtime as needed.      fish oil-omega-3 fatty acids 1000 MG capsule Take 2 g by mouth 2 (two) times daily.     flecainide (TAMBOCOR) 100 MG tablet Take 1 tablet (100 mg total) by mouth 2 (two) times daily. 180 tablet 3   fluticasone (FLONASE) 50 MCG/ACT nasal spray Place 1 spray into both nostrils daily as needed for allergies or rhinitis.     losartan-hydrochlorothiazide (HYZAAR) 100-12.5 MG tablet Take 1 tablet by mouth once daily 90 tablet 3   simvastatin (ZOCOR) 40 MG tablet TAKE 1 TABLET BY MOUTH AT BEDTIME 90 tablet 3   No current facility-administered medications on file prior to  visit.    Allergies  Allergen Reactions   Ace Inhibitors Cough    Other reaction(s): Cough (ALLERGY/intolerance)    Past Medical History:  Diagnosis Date   Abnormal echocardiogram Feb 2013   biatrial enlargement. EF 45 to 50%.    Carotid stenosis, left    moderate Left carotid stenosis   H/O: rheumatic fever    Hypercholesteremia    Hypertension    Normal nuclear stress test Oct 2009   No ischemia with EF of 63%   Paroxysmal A-fib (HCC)    on Flecainide; converted spontaneously and cardioverted cancelled    Past Surgical History:  Procedure Laterality Date   CHOLECYSTECTOMY     INGUINAL HERNIA REPAIR     Status post left inguinal hernia repair    Social History   Tobacco Use  Smoking Status Former   Types: Cigarettes   Quit date: 01/31/1975   Years since quitting: 46.8  Smokeless Tobacco Never    Social History   Substance and Sexual Activity  Alcohol Use No    Family History  Problem Relation Age of Onset   Heart disease Mother    Stroke Mother     Review of Systems: The review of systems is per the HPI.  All other systems were reviewed and are negative.  Physical Exam: There were no vitals taken for this visit. GENERAL:  Well appearing, obese WM in NAD HEENT:  PERRL, EOMI, sclera are  clear. Oropharynx is clear. NECK:  No jugular venous distention, carotid upstroke brisk and symmetric, no bruits, no thyromegaly or adenopathy LUNGS:  Clear to auscultation bilaterally CHEST:  Unremarkable HEART:  RRR,  PMI not displaced or sustained,S1 and S2 within normal limits, no S3, no S4: no clicks, no rubs, no murmurs ABD:  Soft, nontender. BS +, no masses or bruits. No hepatomegaly, no splenomegaly EXT:  2 + pulses throughout, no edema, no cyanosis no clubbing SKIN:  Warm and dry.  No rashes NEURO:  Alert and oriented x 3. Cranial nerves II through XII intact. PSYCH:  Cognitively intact   LABORATORY DATA:  Lab Results  Component Value Date   WBC 6.2  04/10/2014   HGB 13.5 04/10/2014   HCT 38.7 (L) 04/10/2014   PLT 205 04/10/2014   GLUCOSE 97 05/10/2018   CHOL 136 05/10/2018   TRIG 75 05/10/2018   HDL 51 05/10/2018   LDLCALC 70 05/10/2018   ALT 17 05/10/2018   AST 19 05/10/2018   NA 137 05/10/2018   K 3.9 05/10/2018   CL 100 05/10/2018   CREATININE 0.82 05/10/2018   BUN 20 05/10/2018   CO2 22 05/10/2018   TSH 3.12 07/11/2011   INR 1.3 (H) 08/29/2011   Labs dated 03/07/19: cholesterol 127, triglycerides 76, HDL 52, LDL 69. Glucose 113. Otherwise CMET normal Dated 05/16/19: normal CBC.  Dated 03/05/20: A1c 5.2%. glucose 107, otherwise CMET normal. Cholesterol 152, triglycerides 112, HDL 53, LDL 76. CBC normal. Dated 03/12/21: Hgb 13.3. cholesterol 151, triglycerides 91, HDL 56, LDL 75. CMET and CBC otherwise normal.    Myoview 12/02/19: Study Highlights    The left ventricular ejection fraction is mildly decreased (45-54%). Nuclear stress EF: 53%. There was no ST segment deviation noted during stress. The study is normal. This is a low risk study.   Normal resting and stress perfusion. No ischemia or infarction EF 53%      Assessment / Plan: 1. Paroxysmal Atrial fibrillation, well controlled on flecainide. Only one episode in 10 years.  Henry Barnett score is 3. Continue Eliquis and Tambecor.   2. Carotid arterial disease. Carotid dopplers show moderate bilateral disease. Repeat Nov.  showed 40-59% bilateral stenosis unchanged from prior. Will continue risk factor modification and repeat yearly doppler.  3. Hypertension, well controlled.  4. Hyperlipidemia. Good control on simvastatin.   5. Chest tightness. Resolved. Myoview was normal.    6. Vagal episode. Maintain good hydration.   Continue current therapy. Follow up in 6 months.

## 2021-11-26 ENCOUNTER — Encounter: Payer: Self-pay | Admitting: Cardiology

## 2021-11-26 ENCOUNTER — Ambulatory Visit: Payer: Medicare HMO | Admitting: Cardiology

## 2021-11-26 VITALS — BP 120/70 | HR 54 | Ht 73.0 in | Wt 242.6 lb

## 2021-11-26 DIAGNOSIS — I480 Paroxysmal atrial fibrillation: Secondary | ICD-10-CM

## 2021-11-26 DIAGNOSIS — I6523 Occlusion and stenosis of bilateral carotid arteries: Secondary | ICD-10-CM | POA: Diagnosis not present

## 2021-11-26 DIAGNOSIS — E7800 Pure hypercholesterolemia, unspecified: Secondary | ICD-10-CM

## 2021-11-26 NOTE — Patient Instructions (Signed)

## 2021-12-10 ENCOUNTER — Other Ambulatory Visit: Payer: Self-pay | Admitting: Cardiology

## 2022-01-02 ENCOUNTER — Other Ambulatory Visit: Payer: Self-pay | Admitting: Cardiology

## 2022-01-02 NOTE — Telephone Encounter (Signed)
Prescription refill request for Eliquis received. Indication: PAF Last office visit: 11/26/21  P [place] MD Scr: 0.85 on 03/12/21 Age: 76 Weight: 110kg  Based on above findings Eliquis 5mg  twice daily is the appropriate dose.  Refill approved.

## 2022-05-06 ENCOUNTER — Other Ambulatory Visit: Payer: Self-pay

## 2022-05-06 ENCOUNTER — Ambulatory Visit (HOSPITAL_COMMUNITY)
Admission: RE | Admit: 2022-05-06 | Discharge: 2022-05-06 | Disposition: A | Discharge status: Home / Self Care | Payer: Medicare HMO | Source: Ambulatory Visit | Attending: Cardiology | Admitting: Cardiology

## 2022-05-06 DIAGNOSIS — I6523 Occlusion and stenosis of bilateral carotid arteries: Secondary | ICD-10-CM | POA: Diagnosis not present

## 2022-05-06 DIAGNOSIS — I779 Disorder of arteries and arterioles, unspecified: Secondary | ICD-10-CM | POA: Diagnosis not present

## 2022-05-06 DIAGNOSIS — E785 Hyperlipidemia, unspecified: Secondary | ICD-10-CM | POA: Insufficient documentation

## 2022-05-06 DIAGNOSIS — Z87891 Personal history of nicotine dependence: Secondary | ICD-10-CM | POA: Diagnosis not present

## 2022-05-06 DIAGNOSIS — I10 Essential (primary) hypertension: Secondary | ICD-10-CM | POA: Diagnosis not present

## 2022-05-06 NOTE — Progress Notes (Signed)
Spoke to patient carotid doppler results given.Advised to repeat in 1 year.

## 2022-05-27 NOTE — Progress Notes (Signed)
Henry Barnett Date of Birth: [birth year] Medical Record #[id number]  History of Present Illness: Henry Barnett is seen for evaluation of chest pain. He has a history of paroxysmal atrial fibrillation and is on flecainide. Last Afib was in Jan 2013. His other issues include HTN, obesity, ED, HLD, carotid disease.  He had a stress Myoview study in March 2014 which was normal. Carotid dopplers in November 2019 showed 40-59% bilateral disease which is stable.   When seen in June  he was experiencing intermittent chest tightness. Myoview study was done and was normal. Repeat carotid dopplers in November showed bilateral 40-59% stenosis- unchanged.   He notes that in August 2022 he had Afib. No clear triggers. HR around 110. Lasted about 4 hours then resolved. This past weekend was singing in church choir and he became very hot and lightheaded. Sweaty. Sat down and once he got his robes off and drank some water symptoms went away. EMS was called. Ecg showed NSR rate 60 and no acute change. BP was OK.   On follow up today he states he is doing well. No episodes of Afib. Rare chest pain. No dyspnea did have an episode playing cards when he had some transient difficulty doing calculations. He is active with his walking    Current Outpatient Medications on File Prior to Visit  Medication Sig Dispense Refill   apixaban (ELIQUIS) 5 MG TABS tablet Take 1 tablet by mouth twice daily 180 tablet 1   clobetasol (TEMOVATE) 0.05 % GEL Apply topically 3 (three) times daily.     diphenhydramine-acetaminophen (TYLENOL PM) 25-500 MG TABS tablet Take 2 tablets by mouth at bedtime as needed.      fish oil-omega-3 fatty acids 1000 MG capsule Take 2 g by mouth 2 (two) times daily.     flecainide (TAMBOCOR) 100 MG tablet Take 1 tablet by mouth twice daily 180 tablet 3   fluticasone (FLONASE) 50 MCG/ACT nasal spray Place 1 spray into both nostrils daily as needed for allergies or rhinitis.     losartan-hydrochlorothiazide  (HYZAAR) 100-12.5 MG tablet Take 1 tablet by mouth once daily 90 tablet 3   simvastatin (ZOCOR) 40 MG tablet TAKE 1 TABLET BY MOUTH AT BEDTIME 90 tablet 3   No current facility-administered medications on file prior to visit.    Allergies  Allergen Reactions   Ace Inhibitors Cough    Other reaction(s): Cough (ALLERGY/intolerance)    Past Medical History:  Diagnosis Date   Abnormal echocardiogram Feb 2013   biatrial enlargement. EF 45 to 50%.    Carotid stenosis, left    moderate Left carotid stenosis   H/O: rheumatic fever    Hypercholesteremia    Hypertension    Normal nuclear stress test Oct 2009   No ischemia with EF of 63%   Paroxysmal A-fib (HCC)    on Flecainide; converted spontaneously and cardioverted cancelled    Past Surgical History:  Procedure Laterality Date   CHOLECYSTECTOMY     INGUINAL HERNIA REPAIR     Status post left inguinal hernia repair    Social History   Tobacco Use  Smoking Status Former   Types: Cigarettes   Quit date: 01/31/1975   Years since quitting: 47.3  Smokeless Tobacco Never    Social History   Substance and Sexual Activity  Alcohol Use No    Family History  Problem Relation Age of Onset   Heart disease Mother    Stroke Mother     Review of Systems: The  review of systems is per the HPI.  All other systems were reviewed and are negative.  Physical Exam: BP 128/62 (BP Location: Right Arm, Cuff Size: Large)   Pulse (!) 49   Ht 6\' 1"  (1.854 m)   Wt 245 lb 3.2 oz (111.2 kg)   SpO2 96%   BMI 32.35 kg/m  GENERAL:  Well appearing, obese WM in NAD HEENT:  PERRL, EOMI, sclera are clear. Oropharynx is clear. NECK:  No jugular venous distention, carotid upstroke brisk and symmetric, no bruits, no thyromegaly or adenopathy LUNGS:  Clear to auscultation bilaterally CHEST:  Unremarkable HEART:  RRR,  PMI not displaced or sustained,S1 and S2 within normal limits, no S3, no S4: no clicks, no rubs, no murmurs ABD:  Soft,  nontender. BS +, no masses or bruits. No hepatomegaly, no splenomegaly EXT:  2 + pulses throughout, no edema, no cyanosis no clubbing SKIN:  Warm and dry.  No rashes NEURO:  Alert and oriented x 3. Cranial nerves II through XII intact. PSYCH:  Cognitively intact   LABORATORY DATA:  Lab Results  Component Value Date   WBC 6.2 04/10/2014   HGB 13.5 04/10/2014   HCT 38.7 (L) 04/10/2014   PLT 205 04/10/2014   GLUCOSE 97 05/10/2018   CHOL 136 05/10/2018   TRIG 75 05/10/2018   HDL 51 05/10/2018   LDLCALC 70 05/10/2018   ALT 17 05/10/2018   AST 19 05/10/2018   NA 137 05/10/2018   K 3.9 05/10/2018   CL 100 05/10/2018   CREATININE 0.82 05/10/2018   BUN 20 05/10/2018   CO2 22 05/10/2018   TSH 3.12 07/11/2011   INR 1.3 (H) 08/29/2011   Labs dated 03/07/19: cholesterol 127, triglycerides 76, HDL 52, LDL 69. Glucose 113. Otherwise CMET normal Dated 05/16/19: normal CBC.  Dated 03/05/20: A1c 5.2%. glucose 107, otherwise CMET normal. Cholesterol 152, triglycerides 112, HDL 53, LDL 76. CBC normal. Dated 03/12/21: Hgb 13.3. cholesterol 151, triglycerides 91, HDL 56, LDL 75. CMET and CBC otherwise normal. Dated 03/18/22: hgb 13.6. A1c 5.4%. cholesterol 131, triglycerides 90, HDL 47. LDL 68. Otherwise CBC, CMET, TSH normal      Myoview 12/02/19: Study Highlights    The left ventricular ejection fraction is mildly decreased (45-54%). Nuclear stress EF: 53%. There was no ST segment deviation noted during stress. The study is normal. This is a low risk study.   Normal resting and stress perfusion. No ischemia or infarction EF 53%    Carotid dopplers 05/06/22: Summary:  Right Carotid: Velocities in the right ICA are consistent with a 40-59%                 stenosis.   Left Carotid: Velocities in the left ICA are consistent with a 60-79%  stenosis.   Vertebrals: Bilateral vertebral arteries demonstrate antegrade flow.  Subclavians: Normal flow hemodynamics were seen in bilateral  subclavian               arteries.   Assessment / Plan: 1. Paroxysmal Atrial fibrillation, well controlled on flecainide. Infrequent episodes. 05/08/22 VASC score is 3. Continue Eliquis and Tambecor. If he were to have more frequent episodes on Flecainide I would refer to EP to consider ablation.  2. Carotid arterial disease. Carotid dopplers show moderate bilateral disease. Repeat Nov 21 showed no change.   3. Hypertension, well controlled.  4. Hyperlipidemia. Good control on simvastatin.     Continue current therapy. Follow up in 6 months.

## 2022-06-02 ENCOUNTER — Encounter: Payer: Self-pay | Admitting: Cardiology

## 2022-06-02 ENCOUNTER — Ambulatory Visit: Payer: Medicare HMO | Attending: Cardiology | Admitting: Cardiology

## 2022-06-02 VITALS — BP 128/62 | HR 49 | Ht 73.0 in | Wt 245.2 lb

## 2022-06-02 DIAGNOSIS — I480 Paroxysmal atrial fibrillation: Secondary | ICD-10-CM

## 2022-06-02 DIAGNOSIS — I779 Disorder of arteries and arterioles, unspecified: Secondary | ICD-10-CM

## 2022-06-02 DIAGNOSIS — E7800 Pure hypercholesterolemia, unspecified: Secondary | ICD-10-CM

## 2022-06-02 DIAGNOSIS — I10 Essential (primary) hypertension: Secondary | ICD-10-CM | POA: Diagnosis not present

## 2022-07-03 ENCOUNTER — Other Ambulatory Visit: Payer: Self-pay | Admitting: Cardiology

## 2022-07-03 NOTE — Telephone Encounter (Signed)
Prescription refill request for Eliquis received. Indication: PAF Last office visit:  06/02/22  P [place] MD Scr: 0.80 on 03/18/22 Age:  77 Weight: 111.2kg  Based on above finding Eliquis 5mg  twice daily is the appropriate dose.  Refill approved.

## 2022-07-09 ENCOUNTER — Telehealth: Payer: Self-pay | Admitting: *Deleted

## 2022-07-09 DIAGNOSIS — Z01810 Encounter for preprocedural cardiovascular examination: Secondary | ICD-10-CM

## 2022-07-09 NOTE — Telephone Encounter (Signed)
   Pre-operative Risk Assessment    Patient Name: Henry Barnett  DOB: [birth year] MRN: [id number]      Request for Surgical Clearance    Procedure:   COLONOSCOPY  Date of Surgery:  Clearance 07/30/22                                 Surgeon:  DR. [facility] Surgeon's Group or Practice Name:  [facility] Phone number:  [phone] Fax number:  [phone]   Type of Clearance Requested:   - Medical  - Pharmacy:  Hold Apixaban (Eliquis)     Type of Anesthesia:  Not Indicated; (PROPOFOL?)   Additional requests/questions:    [name]   07/09/2022, 9:14 AM

## 2022-07-11 ENCOUNTER — Telehealth: Payer: Self-pay | Admitting: *Deleted

## 2022-07-11 NOTE — Telephone Encounter (Signed)
Pt agreeable to tele pre op appt 08/13/22 @ 2:20. Med rec and consent are done.      Patient Consent for Virtual Visit        Hondo Nanda has provided verbal consent on 07/11/2022 for a virtual visit (video or telephone).   CONSENT FOR VIRTUAL VISIT FOR:  [name]  By participating in this virtual visit I agree to the following:  I hereby voluntarily request, consent and authorize [facility] and its employed or contracted physicians, physician assistants, nurse practitioners or other licensed health care professionals (the Practitioner), to provide me with telemedicine health care services (the "Services") as deemed necessary by the treating Practitioner. I acknowledge and consent to receive the Services by the Practitioner via telemedicine. I understand that the telemedicine visit will involve communicating with the Practitioner through live audiovisual communication technology and the disclosure of certain medical information by electronic transmission. I acknowledge that I have been given the opportunity to request an in-person assessment or other available alternative prior to the telemedicine visit and am voluntarily participating in the telemedicine visit.  I understand that I have the right to withhold or withdraw my consent to the use of telemedicine in the course of my care at any time, without affecting my right to future care or treatment, and that the Practitioner or I may terminate the telemedicine visit at any time. I understand that I have the right to inspect all information obtained and/or recorded in the course of the telemedicine visit and may receive copies of available information for a reasonable fee.  I understand that some of the potential risks of receiving the Services via telemedicine include:  Delay or interruption in medical evaluation due to technological equipment failure or disruption; Information transmitted may not be sufficient (e.g. poor  resolution of images) to allow for appropriate medical decision making by the Practitioner; and/or  In rare instances, security protocols could fail, causing a breach of personal health information.  Furthermore, I acknowledge that it is my responsibility to provide information about my medical history, conditions and care that is complete and accurate to the best of my ability. I acknowledge that Practitioner's advice, recommendations, and/or decision may be based on factors not within their control, such as incomplete or inaccurate data provided by me or distortions of diagnostic images or specimens that may result from electronic transmissions. I understand that the practice of medicine is not an exact science and that Practitioner makes no warranties or guarantees regarding treatment outcomes. I acknowledge that a copy of this consent can be made available to me via my patient portal ([facility]), or I can request a printed copy by calling the office of [facility].    I understand that my insurance will be billed for this visit.   I have read or had this consent read to me. I understand the contents of this consent, which adequately explains the benefits and risks of the Services being provided via telemedicine.  I have been provided ample opportunity to ask questions regarding this consent and the Services and have had my questions answered to my satisfaction. I give my informed consent for the services to be provided through the use of telemedicine in my medical care

## 2022-07-11 NOTE — Telephone Encounter (Signed)
Pt agreeable to tele pre op appt 08/13/22 @ 2:20. Med rec and consent are done.

## 2022-07-11 NOTE — Telephone Encounter (Signed)
   Name: Henry Barnett  DOB: [birth year]  MRN: [id number]  Primary Cardiologist: Peter Martinique, MD  Chart reviewed as part of pre-operative protocol coverage. Because of Antavius Lafosse's past medical history and time since last visit, he will require a follow-up telephone visit in order to better assess preoperative cardiovascular risk.  Pre-op covering staff: - Please schedule appointment and call patient to inform them. If patient already had an upcoming appointment within acceptable timeframe, please add "pre-op clearance" to the appointment notes so provider is aware. - Please contact requesting surgeon's office via preferred method (i.e, phone, fax) to inform them of need for appointment prior to surgery.  Per office protocol, patient can hold Eliquis  for 1-2 days prior to procedure.  Please restart when medically safe to do so.   Elgie Collard, PA-C  07/11/2022, 12:22 PM

## 2022-07-11 NOTE — Telephone Encounter (Signed)
Patient with diagnosis of PAF(paroxysmal atrial fibrillation) on Eliquis for anticoagulation.    Procedure: Colonoscopy  Date of procedure: 07/30/2022   CHA2DS2-VASc Score = 3   This indicates a 3.2% annual risk of stroke. The patient's score is based upon: CHF History: 0 HTN History: 1 Diabetes History: 0 Stroke History: 0 Vascular Disease History: 0 Age Score: 2 Gender Score: 0      CrCl >90 ml/min (03/18/2022) Platelet count 205   Per office protocol, patient can hold Eliquis  for 1-2 days prior to procedure.     **This guidance is not considered finalized until pre-operative APP has relayed final recommendations.**

## 2022-07-18 ENCOUNTER — Ambulatory Visit: Payer: Medicare HMO | Attending: Cardiovascular Disease | Admitting: Physician Assistant

## 2022-07-18 DIAGNOSIS — Z01810 Encounter for preprocedural cardiovascular examination: Secondary | ICD-10-CM

## 2022-07-18 NOTE — Progress Notes (Signed)
Virtual Visit via Telephone Note   Because of Henry Barnett's co-morbid illnesses, he is at least at moderate risk for complications without adequate follow up.  This format is felt to be most appropriate for this patient at this time.  The patient did not have access to video technology/had technical difficulties with video requiring transitioning to audio format only (telephone).  All issues noted in this document were discussed and addressed.  No physical exam could be performed with this format.  Please refer to the patient's chart for his consent to telehealth for [facility].  Evaluation Performed:  Preoperative cardiovascular risk assessment _____________   Date:  07/18/2022   Patient ID:  Henry Barnett, Henry Barnett 1945/07/14, MRN [id number] Patient Location:  Home Provider location:   Office  Primary Care Provider:  Aletha Halim., Henry Barnett Primary Cardiologist:  Henry Martinique, MD  Chief Complaint / Patient Profile   77 y.o. y/o male with a h/o PAF, HTN, HLD and carotid artery disease who is pending colonoscopy by Dr. Therisa Barnett and presents today for telephonic preoperative cardiovascular risk assessment.  History of Present Illness    Henry Barnett is a 77 y.o. male who presents via audio/video conferencing for a telehealth visit today.  Pt was last seen in cardiology clinic on 06/02/2022 by Dr. Peter Barnett.  At that time Henry Barnett was doing well.  The patient is now pending procedure as outlined above. Since his last visit, he has been doing well without exertional chest pain or worsening dyspnea  Past Medical History    Past Medical History:  Diagnosis Date   Abnormal echocardiogram Feb 2013   biatrial enlargement. EF 45 to 50%.    Carotid stenosis, left    moderate Left carotid stenosis   H/O: rheumatic fever    Hypercholesteremia    Hypertension    Normal nuclear stress test Oct 2009   No ischemia with EF of 63%   Paroxysmal A-fib (HCC)    on Flecainide;  converted spontaneously and cardioverted cancelled   Past Surgical History:  Procedure Laterality Date   CHOLECYSTECTOMY     INGUINAL HERNIA REPAIR     Status post left inguinal hernia repair    Allergies  Allergies  Allergen Reactions   Ace Inhibitors Cough    Other reaction(s): Cough (ALLERGY/intolerance)    Home Medications    Prior to Admission medications   Medication Sig Start Date End Date Taking? Authorizing Provider  apixaban (ELIQUIS) 5 MG TABS tablet Take 1 tablet by mouth twice daily 07/03/22   Barnett, Henry M, MD  clobetasol (TEMOVATE) 0.05 % GEL Apply topically 3 (three) times daily. 01/01/22   [provider]  diphenhydramine-acetaminophen (TYLENOL PM) 25-500 MG TABS tablet Take 2 tablets by mouth at bedtime as needed.     [provider]  fish oil-omega-3 fatty acids 1000 MG capsule Take 2 g by mouth 2 (two) times daily.    [provider]  flecainide (TAMBOCOR) 100 MG tablet Take 1 tablet by mouth twice daily 12/10/21   Barnett, Henry M, MD  fluticasone [facility]) 50 MCG/ACT nasal spray Place 1 spray into both nostrils daily as needed for allergies or rhinitis.    [provider]  losartan-hydrochlorothiazide [facility]) 100-12.5 MG tablet Take 1 tablet by mouth once daily 09/03/21   Barnett, Henry M, MD  simvastatin (ZOCOR) 40 MG tablet TAKE 1 TABLET BY MOUTH AT BEDTIME 09/03/21   Barnett, Henry M, MD    Physical Exam  Vital Signs:  Henry Barnett does not have vital signs available for review today.  Given telephonic nature of communication, physical exam is limited. AAOx3. NAD. Normal affect.  Speech and respirations are unlabored.  Accessory Clinical Findings    None  Assessment & Plan    1.  Preoperative Cardiovascular Risk Assessment:  -Henry Barnett is a 77 year old male with past medical of PAF on Eliquis.  He has been doing well without exertional chest pain or worsening dyspnea.  He has last Myoview was in 2021.  He has  not had any recurrence of A-fib recently.  He is on flecainide to help him stay in sinus rhythm.  Overall, he is very stable from the cardiac perspective and is at acceptable risk to undergo colonoscopy procedure.  He will need to hold Eliquis for 1 to 2 days prior to the procedure and fish oil for 7 days prior to the procedure.  He may resume both as soon as possible afterward at the GI doctor's discretion based on the bleeding risk.   The patient was advised that if he develops new symptoms prior to surgery to contact our office to arrange for a follow-up visit, and he verbalized understanding.   A copy of this note will be routed to requesting surgeon.  Time:   Today, I have spent 4 minutes with the patient with telehealth technology discussing medical history, symptoms, and management plan.     [place], [place]  07/18/2022, 2:13 PM

## 2022-09-30 ENCOUNTER — Other Ambulatory Visit: Payer: Self-pay | Admitting: *Deleted

## 2022-09-30 DIAGNOSIS — I480 Paroxysmal atrial fibrillation: Secondary | ICD-10-CM

## 2022-09-30 MED ORDER — LOSARTAN POTASSIUM-HCTZ 100-12.5 MG PO TABS: 1.0000 | ORAL_TABLET | Freq: Every day | ORAL | 1 refills | Status: DC

## 2022-10-19 ENCOUNTER — Other Ambulatory Visit: Payer: Self-pay | Admitting: Cardiology

## 2022-11-26 NOTE — Progress Notes (Signed)
Cardiology Clinic Note   Patient Name: Henry Barnett Date of Encounter: 12/05/2022  Primary Care Provider:  Richmond Barnett., PA-C Primary Cardiologist:  Henry Swaziland, MD  Patient Profile    77 year old male with history of PAF, CHA2DS2-VASc score of 3, on Eliquis, hypertension, hyperlipidemia, and carotid artery disease.  Last encounter was on 07/18/2022 as a preoperative evaluation for colonoscopy.  Past Medical History    Past Medical History:  Diagnosis Date   Abnormal echocardiogram Feb 2013   biatrial enlargement. EF 45 to 50%.    Carotid stenosis, left    moderate Left carotid stenosis   H/O: rheumatic fever    Hypercholesteremia    Hypertension    Normal nuclear stress test Oct 2009   No ischemia with EF of 63%   Paroxysmal A-fib (HCC)    on Flecainide; converted spontaneously and cardioverted cancelled   Past Surgical History:  Procedure Laterality Date   CHOLECYSTECTOMY     INGUINAL HERNIA REPAIR     Status post left inguinal hernia repair    Allergies  Allergies  Allergen Reactions   Ace Inhibitors Cough    Other reaction(s): Cough (ALLERGY/intolerance)    History of Present Illness    [name] returns today for ongoing assessment and management of PAF, hypertension, and hyperlipidemia with other history to include carotid artery disease.  [name] has complaints of recurrent palpitations and heart rate elevation which occurred x 2 over the last month.  Once while working in his garden.  He has a heart rate monitor on his phone and 6 it was found to be elevated around 120 bpm.  Lasted approximately 2 hours.  He had another episode yesterday with elevation around 102 bpm which lasted only a few minutes.  The patient remains very active walks 3 to 5 miles daily, works in his garden, and plays golf once a week.  He was essentially asymptomatic, no associated chest pain, dizziness shortness of breath or fatigue.  He has been medically compliant.  He admits  to being under some situational stress due to his brother having some significant health issues.  Home Medications    Current Outpatient Medications  Medication Sig Dispense Refill   apixaban (ELIQUIS) 5 MG TABS tablet Take 1 tablet by mouth twice daily 180 tablet 1   diltiazem (CARDIZEM) 30 MG tablet Take 1 tablet (30 mg total) by mouth as needed. Take 1 Tablet As Needed For Rapid Heart Rate. 10 tablet 0   diphenhydramine-acetaminophen (TYLENOL PM) 25-500 MG TABS tablet Take 2 tablets by mouth at bedtime as needed.      fish oil-omega-3 fatty acids 1000 MG capsule Take 2 g by mouth 2 (two) times daily.     flecainide (TAMBOCOR) 100 MG tablet Take 1 tablet by mouth twice daily 180 tablet 0   fluticasone (FLONASE) 50 MCG/ACT nasal spray Place 1 spray into both nostrils daily as needed for allergies or rhinitis.     Lactobacillus Rhamnosus, GG, (CULTURELLE) CAPS Take by mouth.     losartan-hydrochlorothiazide (HYZAAR) 100-12.5 MG tablet Take 1 tablet by mouth daily. 90 tablet 1   Probiotic Product (ALIGN) 4 MG CAPS as directed Orally     simvastatin (ZOCOR) 40 MG tablet TAKE 1 TABLET BY MOUTH AT BEDTIME 90 tablet 1   Wheat Dextrin (BENEFIBER) POWD as directed Orally     No current facility-administered medications for this visit.     Family History    Family History  Problem Relation Age of Onset  Heart disease Mother    Stroke Mother    He indicated that his mother is deceased. He indicated that his father is deceased. He indicated that his maternal grandmother is deceased. He indicated that his maternal grandfather is deceased. He indicated that his paternal grandmother is deceased. He indicated that his paternal grandfather is deceased.  Social History    Social History   Socioeconomic History   Marital status: Married    Spouse name: Not on file   Number of children: 2   Years of education: Not on file   Highest education level: Not on file  Occupational History   Not on  file  Tobacco Use   Smoking status: Former    Types: Cigarettes    Quit date: 01/31/1975    Years since quitting: 47.8   Smokeless tobacco: Never  Vaping Use   Vaping Use: Never used  Substance and Sexual Activity   Alcohol use: No   Drug use: No   Sexual activity: Yes  Other Topics Concern   Not on file  Social History Narrative   Not on file   Social Determinants of Health   Financial Resource Strain: Not on file  Food Insecurity: Not on file  Transportation Needs: Not on file  Physical Activity: Not on file  Stress: Not on file  Social Connections: Not on file  Intimate Partner Violence: Not on file     Review of Systems    General:  No chills, fever, night sweats or weight changes.  Cardiovascular:  No chest pain, dyspnea on exertion, edema, orthopnea, palpitations, paroxysmal nocturnal dyspnea. Dermatological: No rash, lesions/masses Respiratory: No cough, dyspnea Urologic: No hematuria, dysuria Abdominal:   No nausea, vomiting, diarrhea, bright red blood per rectum, melena, or hematemesis Neurologic:  No visual changes, wkns, changes in mental status. All other systems reviewed and are otherwise negative except as noted above.     Physical Exam    VS:  BP (!) 148/64   Pulse 61   Ht 6' (1.829 m)   Wt 243 lb 3.2 oz (110.3 kg)   SpO2 95%   BMI 32.98 kg/m  , BMI Body mass index is 32.98 kg/m.     GEN: Well nourished, well developed, in no acute distress. HEENT: normal. Neck: Supple, no JVD, carotid bruits, or masses. Cardiac: RRR, no murmurs, rubs, or gallops. No clubbing, cyanosis, edema.  Radials/DP/PT 2+ and equal bilaterally.  Respiratory:  Respirations regular and unlabored, clear to auscultation bilaterally. GI: Soft, nontender, nondistended, BS + x 4. MS: no deformity or atrophy. Skin: warm and dry, no rash. Neuro:  Strength and sensation are intact. Psych: Normal affect.  Accessory Clinical Findings    ECG personally reviewed by me  today-normal sinus rhythm, heart rate 61 bpm, QTc 442 ms, QRS 108 ms.  Lab Results  Component Value Date   WBC 6.2 04/10/2014   HGB 13.5 04/10/2014   HCT 38.7 (L) 04/10/2014   MCV 82.5 04/10/2014   PLT 205 04/10/2014   Lab Results  Component Value Date   CREATININE 0.82 05/10/2018   BUN 20 05/10/2018   NA 137 05/10/2018   K 3.9 05/10/2018   CL 100 05/10/2018   CO2 22 05/10/2018   Lab Results  Component Value Date   ALT 17 05/10/2018   AST 19 05/10/2018   ALKPHOS 65 05/10/2018   BILITOT 0.6 05/10/2018   Lab Results  Component Value Date   CHOL 136 05/10/2018   HDL 51 05/10/2018  LDLCALC 70 05/10/2018   TRIG 75 05/10/2018   CHOLHDL 2.7 05/10/2018    No results found for: "HGBA1C"  Review of Prior Studies: Carotid Ultrasound 05/06/2022 Summary:  Right Carotid: Velocities in the right ICA are consistent with a 40-59%                 stenosis.   Left Carotid: Velocities in the left ICA are consistent with a 60-79%  stenosis.   Vertebrals: Bilateral vertebral arteries demonstrate antegrade flow.  Subclavians: Normal flow hemodynamics were seen in bilateral subclavian               arteries.   *See table(s) above for measurements and observations.  Suggest follow up study in 12 months.    Assessment & Plan   1.  Paroxysmal atrial fibrillation: Currently on flecainide 100 mg daily.  The patient is lately been having some breakthrough rapid heart rhythm once lasting a few hours second lasting only a few minutes.  He is medically compliant on Eliquis.  I will give him diltiazem 30 mg which she can take as needed for rapid heart rate that lasts greater than an hour.  If this becomes persistent we will have him be seen in A-fib clinic for for other recommendations and adjustments of medications.  In the interim no other changes to his medication regimen.  2.  Hypertension: Blood pressure slightly elevated today but has come down once seated in the clinic room.  Continue  losartan HCTZ 100/12.5 mg daily.  Labs are followed by PCP if not done by the time he has his next appointment I would like to have these completed to evaluate kidney function and potassium status.  3.  Hypercholesterolemia: Remains on simvastatin 40 mg daily and fish oil 2 g daily.  Most recent labs were completed by primary care provider with good control of cholesterol at that time.  Will need to have fasting lipids and LFTs on next office visit if this has not been completed recently by PCP.  4.  Carotid artery disease: Has annual carotid artery Doppler ultrasounds with most recent in November 2023 showing 60 to 79% left carotid artery plaque and 40 to 59% on the right.  Repeating carotid Doppler ultrasound in November 2024.          Signed, Bettey Mare. Liborio Nixon, ANP, AACC   12/05/2022 12:06 PM      Office [phone] Fax [phone]  Notice: This dictation was prepared with Dragon dictation along with smaller phrase technology. Any transcriptional errors that result from this process are unintentional and may not be corrected upon review.

## 2022-11-30 ENCOUNTER — Other Ambulatory Visit: Payer: Self-pay | Admitting: Cardiology

## 2022-12-05 ENCOUNTER — Ambulatory Visit: Payer: Medicare HMO | Attending: Adult Health | Admitting: Adult Health

## 2022-12-05 ENCOUNTER — Other Ambulatory Visit: Payer: Self-pay | Admitting: Adult Health

## 2022-12-05 ENCOUNTER — Encounter: Payer: Self-pay | Admitting: Adult Health

## 2022-12-05 VITALS — BP 148/64 | HR 61 | Ht 72.0 in | Wt 243.2 lb

## 2022-12-05 DIAGNOSIS — I10 Essential (primary) hypertension: Secondary | ICD-10-CM | POA: Diagnosis not present

## 2022-12-05 DIAGNOSIS — I779 Disorder of arteries and arterioles, unspecified: Secondary | ICD-10-CM

## 2022-12-05 DIAGNOSIS — E7800 Pure hypercholesterolemia, unspecified: Secondary | ICD-10-CM | POA: Diagnosis not present

## 2022-12-05 DIAGNOSIS — I480 Paroxysmal atrial fibrillation: Secondary | ICD-10-CM

## 2022-12-05 MED ORDER — DILTIAZEM HCL 30 MG PO TABS: 30.0000 mg | ORAL_TABLET | ORAL | 0 refills | Status: DC | PRN

## 2022-12-05 NOTE — Patient Instructions (Signed)
Medication Instructions:  Cardizem 30 mg ( Take 1 Tablet As Needed For Rapid Heart Rate.) *If you need a refill on your cardiac medications before your next appointment, please call your pharmacy*   Lab Work: None If you have labs (blood work) drawn today and your tests are completely normal, you will receive your results only by: MyChart Message (if you have MyChart) OR A paper copy in the mail If you have any lab test that is abnormal or we need to change your treatment, we will call you to review the results.   Testing/Procedures: [address], [address]. Your physician has requested that you have a carotid duplex. This test is an ultrasound of the carotid arteries in your neck. It looks at blood flow through these arteries that supply the brain with blood. Allow one hour for this exam. There are no restrictions or special instructions.    Follow-Up: At [facility], you and your health needs are our priority.  As part of our continuing mission to provide you with exceptional heart care, we have created designated Provider Care Teams.  These Care Teams include your primary Cardiologist (physician) and Advanced Practice Providers (APPs -  Physician Assistants and Nurse Practitioners) who all work together to provide you with the care you need, when you need it.  We recommend signing up for the patient portal called "MyChart".  Sign up information is provided on this After Visit Summary.  MyChart is used to connect with patients for Virtual Visits (Telemedicine).  Patients are able to view lab/test results, encounter notes, upcoming appointments, etc.  Non-urgent messages can be sent to your provider as well.   To learn more about what you can do with MyChart, go to [email or web address].    Your next appointment:   6 month(s)  Provider:   Peter Swaziland, MD     Other Instructions If Rapid Heart Rate Continues. Please Call Office.

## 2022-12-11 ENCOUNTER — Telehealth: Payer: Self-pay

## 2022-12-11 ENCOUNTER — Other Ambulatory Visit: Payer: Self-pay

## 2022-12-11 ENCOUNTER — Encounter (HOSPITAL_COMMUNITY): Payer: Medicare HMO

## 2022-12-11 NOTE — Telephone Encounter (Signed)
[facility] is stating that pt takes simvastatin 40 mg tablets. Diltiazem may increase simvastatin related muscle injury. Would Henry Reining, NP like to dispense this medication anyway? Please advise

## 2022-12-11 NOTE — Telephone Encounter (Signed)
Called pt's pharmacy to advise them that the provider is aware and to dispense medication as prescribed. Pharmacist verbalized understanding.

## 2023-01-28 ENCOUNTER — Other Ambulatory Visit: Payer: Self-pay | Admitting: Cardiology

## 2023-01-28 DIAGNOSIS — I480 Paroxysmal atrial fibrillation: Secondary | ICD-10-CM

## 2023-01-28 NOTE — Telephone Encounter (Signed)
Prescription refill request for Eliquis received. Indication: Afib  Last office visit: 12/05/22 Lyman Bishop)  Scr: 0.80 (03/18/22)  Age: 77 Weight: 110.3kg  Appropriate dose. Refill sent.

## 2023-03-02 ENCOUNTER — Other Ambulatory Visit: Payer: Self-pay | Admitting: Cardiology

## 2023-03-28 ENCOUNTER — Other Ambulatory Visit: Payer: Self-pay | Admitting: Cardiology

## 2023-03-28 DIAGNOSIS — I480 Paroxysmal atrial fibrillation: Secondary | ICD-10-CM

## 2023-04-16 ENCOUNTER — Other Ambulatory Visit: Payer: Self-pay | Admitting: Physician Assistant

## 2023-05-04 ENCOUNTER — Ambulatory Visit (HOSPITAL_COMMUNITY)
Admission: RE | Admit: 2023-05-04 | Discharge: 2023-05-04 | Disposition: A | Discharge status: Home / Self Care | Payer: Medicare HMO | Source: Ambulatory Visit | Attending: Cardiology | Admitting: Cardiology

## 2023-05-04 DIAGNOSIS — I6523 Occlusion and stenosis of bilateral carotid arteries: Secondary | ICD-10-CM | POA: Insufficient documentation

## 2023-05-04 DIAGNOSIS — I779 Disorder of arteries and arterioles, unspecified: Secondary | ICD-10-CM | POA: Diagnosis present

## 2023-05-07 ENCOUNTER — Telehealth: Payer: Self-pay

## 2023-05-07 NOTE — Telephone Encounter (Addendum)
Called patient regarding results. Left message for patient to call office.----- Message from Joni Reining sent at 05/04/2023  1:29 PM EST ----- I have reviewed the carotid duplex study. It is unchanged from prior study one year ago. Still has blockages on left between 60%-79%, and this was the same on last test. Continue the medication regimen as directed.

## 2023-05-26 ENCOUNTER — Telehealth: Payer: Self-pay

## 2023-05-26 NOTE — Telephone Encounter (Addendum)
Called patient regarding results. Patient had understanding of results.----- Message from Joni Reining sent at 05/07/2023 12:50 PM EST ----- I have reviewed the carotid ultrasound report. No significant blockages. Should have this repeated annually.    KL

## 2023-06-03 ENCOUNTER — Ambulatory Visit: Payer: Medicare HMO | Admitting: General Practice

## 2023-06-17 NOTE — Progress Notes (Deleted)
 Henry Barnett Date of Birth: 02-Sep-1945 Medical Record #[id number]  History of Present Illness: Henry Barnett is seen for evaluation of chest pain. He has a history of paroxysmal atrial fibrillation and is on flecainide . Last Afib was in Jan 2013. His other issues include HTN, obesity, ED, HLD, carotid disease.  He had a stress Myoview  study in March 2014 which was normal. Carotid dopplers in November 2019 showed 40-59% bilateral disease which is stable.   When seen in June  he was experiencing intermittent chest tightness. Myoview  study was done and was normal. Repeat carotid dopplers in November showed bilateral 40-59% stenosis- unchanged.   He notes that in August 2022 he had Afib. No clear triggers. HR around 110. Lasted about 4 hours then resolved. This past weekend was singing in church choir and he became very hot and lightheaded. Sweaty. Sat down and once he got his robes off and drank some water symptoms went away. EMS was called. Ecg showed NSR rate 60 and no acute change. BP was OK.   On follow up today he states he is doing well. No episodes of Afib. Rare chest pain. No dyspnea did have an episode playing cards when he had some transient difficulty doing calculations. He is active with his walking    Current Outpatient Medications on File Prior to Visit  Medication Sig Dispense Refill   apixaban  (ELIQUIS ) 5 MG TABS tablet Take 1 tablet by mouth twice daily 180 tablet 1   diltiazem  (CARDIZEM ) 30 MG tablet TAKE 1 TABLET BY MOUTH ONCE DAILY AS NEEDED FOR RAPID HEART RATE 10 tablet 0   diphenhydramine-acetaminophen  (TYLENOL  PM) 25-500 MG TABS tablet Take 2 tablets by mouth at bedtime as needed.      fish oil-omega-3 fatty acids 1000 MG capsule Take 2 g by mouth 2 (two) times daily.     flecainide  (TAMBOCOR ) 100 MG tablet TAKE 1 TABLET BY MOUTH TWICE DAILY . APPOINTMENT REQUIRED FOR FUTURE REFILLS 180 tablet 3   fluticasone  (FLONASE ) 50 MCG/ACT nasal spray Place 1 spray into both  nostrils daily as needed for allergies or rhinitis.     Lactobacillus Rhamnosus, GG, (CULTURELLE) CAPS Take by mouth.     losartan -hydrochlorothiazide (HYZAAR) 100-12.5 MG tablet Take 1 tablet by mouth once daily 90 tablet 2   Probiotic Product (ALIGN) 4 MG CAPS as directed Orally     simvastatin  (ZOCOR ) 40 MG tablet TAKE 1 TABLET BY MOUTH AT BEDTIME 90 tablet 2   Wheat Dextrin (BENEFIBER) POWD as directed Orally     No current facility-administered medications on file prior to visit.    Allergies  Allergen Reactions   Ace Inhibitors Cough    Other reaction(s): Cough (ALLERGY/intolerance)    Past Medical History:  Diagnosis Date   Abnormal echocardiogram Feb 2013   biatrial enlargement. EF 45 to 50%.    Carotid stenosis, left    moderate Left carotid stenosis   H/O: rheumatic fever    Hypercholesteremia    Hypertension    Normal nuclear stress test Oct 2009   No ischemia with EF of 63%   Paroxysmal A-fib (HCC)    on Flecainide ; converted spontaneously and cardioverted cancelled    Past Surgical History:  Procedure Laterality Date   CHOLECYSTECTOMY     INGUINAL HERNIA REPAIR     Status post left inguinal hernia repair    Social History   Tobacco Use  Smoking Status Former   Current packs/day: 0.00   Types: Cigarettes   Quit date:  01/31/1975   Years since quitting: 48.4  Smokeless Tobacco Never    Social History   Substance and Sexual Activity  Alcohol Use No    Family History  Problem Relation Age of Onset   Heart disease Mother    Stroke Mother     Review of Systems: The review of systems is per the HPI.  All other systems were reviewed and are negative.  Physical Exam: There were no vitals taken for this visit. GENERAL:  Well appearing, obese WM in NAD HEENT:  PERRL, EOMI, sclera are clear. Oropharynx is clear. NECK:  No jugular venous distention, carotid upstroke brisk and symmetric, no bruits, no thyromegaly or adenopathy LUNGS:  Clear to  auscultation bilaterally CHEST:  Unremarkable HEART:  RRR,  PMI not displaced or sustained,S1 and S2 within normal limits, no S3, no S4: no clicks, no rubs, no murmurs ABD:  Soft, nontender. BS +, no masses or bruits. No hepatomegaly, no splenomegaly EXT:  2 + pulses throughout, no edema, no cyanosis no clubbing SKIN:  Warm and dry.  No rashes NEURO:  Alert and oriented x 3. Cranial nerves II through XII intact. PSYCH:  Cognitively intact   LABORATORY DATA:  Lab Results  Component Value Date   WBC 6.2 04/10/2014   HGB 13.5 04/10/2014   HCT 38.7 (L) 04/10/2014   PLT 205 04/10/2014   GLUCOSE 97 05/10/2018   CHOL 136 05/10/2018   TRIG 75 05/10/2018   HDL 51 05/10/2018   LDLCALC 70 05/10/2018   ALT 17 05/10/2018   AST 19 05/10/2018   NA 137 05/10/2018   K 3.9 05/10/2018   CL 100 05/10/2018   CREATININE 0.82 05/10/2018   BUN 20 05/10/2018   CO2 22 05/10/2018   TSH 3.12 07/11/2011   INR 1.3 (H) 08/29/2011   Labs dated 03/07/19: cholesterol 127, triglycerides 76, HDL 52, LDL 69. Glucose 113. Otherwise CMET normal Dated 05/16/19: normal CBC.  Dated 03/05/20: A1c 5.2%. glucose 107, otherwise CMET normal. Cholesterol 152, triglycerides 112, HDL 53, LDL 76. CBC normal. Dated 03/12/21: Hgb 13.3. cholesterol 151, triglycerides 91, HDL 56, LDL 75. CMET and CBC otherwise normal. Dated 03/18/22: hgb 13.6. A1c 5.4%. cholesterol 131, triglycerides 90, HDL 47. LDL 68. Otherwise CBC, CMET, TSH normal      Myoview  12/02/19: Study Highlights    The left ventricular ejection fraction is mildly decreased (45-54%). Nuclear stress EF: 53%. There was no ST segment deviation noted during stress. The study is normal. This is a low risk study.   Normal resting and stress perfusion. No ischemia or infarction EF 53%    Carotid dopplers 05/06/22: Summary:  Right Carotid: Velocities in the right ICA are consistent with a 40-59%                 stenosis.   Left Carotid: Velocities in the left ICA  are consistent with a 60-79%  stenosis.   Vertebrals: Bilateral vertebral arteries demonstrate antegrade flow.  Subclavians: Normal flow hemodynamics were seen in bilateral subclavian               arteries.   Assessment / Plan: 1. Paroxysmal Atrial fibrillation, well controlled on flecainide . Infrequent episodes. CHAD VASC score is 3. Continue Eliquis  and Tambecor. If he were to have more frequent episodes on Flecainide  I would refer to EP to consider ablation.  2. Carotid arterial disease. Carotid dopplers show moderate bilateral disease. Repeat Nov 21 showed no change.   3. Hypertension, well controlled.  4.  Hyperlipidemia. Good control on simvastatin .     Continue current therapy. Follow up in 6 months.

## 2023-06-25 ENCOUNTER — Ambulatory Visit: Payer: Medicare HMO | Admitting: Cardiology

## 2023-06-29 NOTE — Progress Notes (Unsigned)
Henry Barnett Date of Birth: 1945-09-20 Medical Record #[id number]  History of Present Illness: Mr. Volbrecht is seen for evaluation of chest pain. He has a history of paroxysmal atrial fibrillation and is on flecainide. Last Afib was in Jan 2013. His other issues include HTN, obesity, ED, HLD, carotid disease.  He had a stress Myoview study in March 2014 which was normal. Carotid dopplers in November 2019 showed 40-59% bilateral disease which is stable.   When seen in June  he was experiencing intermittent chest tightness. Myoview study was done and was normal. Repeat carotid dopplers in November showed bilateral 40-59% stenosis- unchanged.   He notes that in August 2022 he had Afib. No clear triggers. HR around 110. Lasted about 4 hours then resolved. This past weekend was singing in church choir and he became very hot and lightheaded. Sweaty. Sat down and once he got his robes off and drank some water symptoms went away. EMS was called. Ecg showed NSR rate 60 and no acute change. BP was OK.   On follow up today he states he is doing well. No episodes of Afib. Rare chest pain. No dyspnea did have an episode playing cards when he had some transient difficulty doing calculations. He is active with his walking    Current Outpatient Medications on File Prior to Visit  Medication Sig Dispense Refill   apixaban (ELIQUIS) 5 MG TABS tablet Take 1 tablet by mouth twice daily 180 tablet 1   diltiazem (CARDIZEM) 30 MG tablet TAKE 1 TABLET BY MOUTH ONCE DAILY AS NEEDED FOR RAPID HEART RATE 10 tablet 0   diphenhydramine-acetaminophen (TYLENOL PM) 25-500 MG TABS tablet Take 2 tablets by mouth at bedtime as needed.      fish oil-omega-3 fatty acids 1000 MG capsule Take 2 g by mouth 2 (two) times daily.     flecainide (TAMBOCOR) 100 MG tablet TAKE 1 TABLET BY MOUTH TWICE DAILY . APPOINTMENT REQUIRED FOR FUTURE REFILLS 180 tablet 3   fluticasone (FLONASE) 50 MCG/ACT nasal spray Place 1 spray into both  nostrils daily as needed for allergies or rhinitis.     Lactobacillus Rhamnosus, GG, (CULTURELLE) CAPS Take by mouth.     losartan-hydrochlorothiazide (HYZAAR) 100-12.5 MG tablet Take 1 tablet by mouth once daily 90 tablet 2   Probiotic Product (ALIGN) 4 MG CAPS as directed Orally     simvastatin (ZOCOR) 40 MG tablet TAKE 1 TABLET BY MOUTH AT BEDTIME 90 tablet 2   Wheat Dextrin (BENEFIBER) POWD as directed Orally     No current facility-administered medications on file prior to visit.    Allergies  Allergen Reactions   Ace Inhibitors Cough    Other reaction(s): Cough (ALLERGY/intolerance)    Past Medical History:  Diagnosis Date   Abnormal echocardiogram Feb 2013   biatrial enlargement. EF 45 to 50%.    Carotid stenosis, left    moderate Left carotid stenosis   H/O: rheumatic fever    Hypercholesteremia    Hypertension    Normal nuclear stress test Oct 2009   No ischemia with EF of 63%   Paroxysmal A-fib (HCC)    on Flecainide; converted spontaneously and cardioverted cancelled    Past Surgical History:  Procedure Laterality Date   CHOLECYSTECTOMY     INGUINAL HERNIA REPAIR     Status post left inguinal hernia repair    Social History   Tobacco Use  Smoking Status Former   Current packs/day: 0.00   Types: Cigarettes   Quit date:  01/31/1975   Years since quitting: 48.4  Smokeless Tobacco Never    Social History   Substance and Sexual Activity  Alcohol Use No    Family History  Problem Relation Age of Onset   Heart disease Mother    Stroke Mother     Review of Systems: The review of systems is per the HPI.  All other systems were reviewed and are negative.  Physical Exam: There were no vitals taken for this visit. GENERAL:  Well appearing, obese WM in NAD HEENT:  PERRL, EOMI, sclera are clear. Oropharynx is clear. NECK:  No jugular venous distention, carotid upstroke brisk and symmetric, no bruits, no thyromegaly or adenopathy LUNGS:  Clear to  auscultation bilaterally CHEST:  Unremarkable HEART:  RRR,  PMI not displaced or sustained,S1 and S2 within normal limits, no S3, no S4: no clicks, no rubs, no murmurs ABD:  Soft, nontender. BS +, no masses or bruits. No hepatomegaly, no splenomegaly EXT:  2 + pulses throughout, no edema, no cyanosis no clubbing SKIN:  Warm and dry.  No rashes NEURO:  Alert and oriented x 3. Cranial nerves II through XII intact. PSYCH:  Cognitively intact   LABORATORY DATA:  Lab Results  Component Value Date   WBC 6.2 04/10/2014   HGB 13.5 04/10/2014   HCT 38.7 (L) 04/10/2014   PLT 205 04/10/2014   GLUCOSE 97 05/10/2018   CHOL 136 05/10/2018   TRIG 75 05/10/2018   HDL 51 05/10/2018   LDLCALC 70 05/10/2018   ALT 17 05/10/2018   AST 19 05/10/2018   NA 137 05/10/2018   K 3.9 05/10/2018   CL 100 05/10/2018   CREATININE 0.82 05/10/2018   BUN 20 05/10/2018   CO2 22 05/10/2018   TSH 3.12 07/11/2011   INR 1.3 (H) 08/29/2011   Labs dated 03/07/19: cholesterol 127, triglycerides 76, HDL 52, LDL 69. Glucose 113. Otherwise CMET normal Dated 05/16/19: normal CBC.  Dated 03/05/20: A1c 5.2%. glucose 107, otherwise CMET normal. Cholesterol 152, triglycerides 112, HDL 53, LDL 76. CBC normal. Dated 03/12/21: Hgb 13.3. cholesterol 151, triglycerides 91, HDL 56, LDL 75. CMET and CBC otherwise normal. Dated 03/18/22: hgb 13.6. A1c 5.4%. cholesterol 131, triglycerides 90, HDL 47. LDL 68. Otherwise CBC, CMET, TSH normal Dated 03/31/23: cholesterol 137, triglycerides 104, HDL 49, LDL 69. A1c 5.5%. CBC and CMET normal.     Myoview 12/02/19: Study Highlights    The left ventricular ejection fraction is mildly decreased (45-54%). Nuclear stress EF: 53%. There was no ST segment deviation noted during stress. The study is normal. This is a low risk study.   Normal resting and stress perfusion. No ischemia or infarction EF 53%    Carotid dopplers 05/06/22: Summary:  Right Carotid: Velocities in the right ICA  are consistent with a 40-59%                 stenosis.   Left Carotid: Velocities in the left ICA are consistent with a 60-79%  stenosis.   Vertebrals: Bilateral vertebral arteries demonstrate antegrade flow.  Subclavians: Normal flow hemodynamics were seen in bilateral subclavian               arteries.   Assessment / Plan: 1. Paroxysmal Atrial fibrillation, well controlled on flecainide. Infrequent episodes. [place] VASC score is 3. Continue Eliquis and Tambecor. If he were to have more frequent episodes on Flecainide I would refer to EP to consider ablation.  2. Carotid arterial disease. Carotid dopplers show moderate bilateral  disease. Repeat Nov 21 showed no change.   3. Hypertension, well controlled.  4. Hyperlipidemia. Good control on simvastatin.     Continue current therapy. Follow up in 6 months.

## 2023-07-07 ENCOUNTER — Ambulatory Visit: Payer: Medicare HMO | Attending: Cardiology | Admitting: Cardiology

## 2023-07-07 ENCOUNTER — Encounter: Payer: Self-pay | Admitting: Cardiology

## 2023-07-07 VITALS — BP 116/66 | HR 57 | Ht 72.0 in | Wt 238.0 lb

## 2023-07-07 DIAGNOSIS — I10 Essential (primary) hypertension: Secondary | ICD-10-CM

## 2023-07-07 DIAGNOSIS — E7800 Pure hypercholesterolemia, unspecified: Secondary | ICD-10-CM | POA: Diagnosis not present

## 2023-07-07 DIAGNOSIS — I480 Paroxysmal atrial fibrillation: Secondary | ICD-10-CM

## 2023-07-07 DIAGNOSIS — I779 Disorder of arteries and arterioles, unspecified: Secondary | ICD-10-CM | POA: Diagnosis not present

## 2023-07-07 NOTE — Patient Instructions (Signed)
Medication Instructions:  Continue same medications *If you need a refill on your cardiac medications before your next appointment, please call your pharmacy*   Lab Work: None ordered   Testing/Procedures: None ordered   Follow-Up: At [facility], you and your health needs are our priority.  As part of our continuing mission to provide you with exceptional heart care, we have created designated Provider Care Teams.  These Care Teams include your primary Cardiologist (physician) and Advanced Practice Providers (APPs -  Physician Assistants and Nurse Practitioners) who all work together to provide you with the care you need, when you need it.  We recommend signing up for the patient portal called "MyChart".  Sign up information is provided on this After Visit Summary.  MyChart is used to connect with patients for Virtual Visits (Telemedicine).  Patients are able to view lab/test results, encounter notes, upcoming appointments, etc.  Non-urgent messages can be sent to your provider as well.   To learn more about what you can do with MyChart, go to [email or web address].    Your next appointment:  6 months    Call in April to schedule July appointment     Provider:  Dr.Jordan

## 2023-07-25 ENCOUNTER — Other Ambulatory Visit: Payer: Self-pay | Admitting: Cardiology

## 2023-07-25 DIAGNOSIS — I480 Paroxysmal atrial fibrillation: Secondary | ICD-10-CM

## 2023-07-27 ENCOUNTER — Other Ambulatory Visit: Payer: Self-pay | Admitting: Cardiology

## 2023-07-27 DIAGNOSIS — I480 Paroxysmal atrial fibrillation: Secondary | ICD-10-CM

## 2023-07-27 NOTE — Telephone Encounter (Signed)
 Prescription refill request for Eliquis  received. Indication:afib Last office visit:1/25 Scr:0.81  10/24 Age: 78 Weight:108  kg  Prescription refilled

## 2023-12-21 NOTE — Progress Notes (Signed)
 Henry Barnett Date of Birth: 04/07/1946 Medical Record #[id number]  History of Present Illness: [name] is seen for follow up. He has a history of paroxysmal atrial fibrillation and is on flecainide .  His other issues include HTN, obesity, ED, HLD, carotid disease.  He had a stress Myoview  study in March 2014 which was normal.   When seen in June 2021 he was experiencing intermittent chest tightness. Myoview  study was done and was normal. Repeat carotid dopplers in November showed bilateral 40-59% stenosis- unchanged.   He notes that in August 2022 he had Afib. No clear triggers. HR around 110. Lasted about 4 hours then resolved.   On follow up today he states he is doing well. No episodes of Afib in several months and it is limited.  No chest pain. No dyspnea. No bleeding. BP at home has been normal.     Current Outpatient Medications on File Prior to Visit  Medication Sig Dispense Refill   diltiazem  (CARDIZEM ) 30 MG tablet TAKE 1 TABLET BY MOUTH ONCE DAILY AS NEEDED FOR RAPID HEART RATE 10 tablet 0   diphenhydramine-acetaminophen  (TYLENOL  PM) 25-500 MG TABS tablet Take 2 tablets by mouth at bedtime as needed.      fish oil-omega-3 fatty acids 1000 MG capsule Take 2 g by mouth 2 (two) times daily.     fluticasone  (FLONASE ) 50 MCG/ACT nasal spray Place 1 spray into both nostrils daily as needed for allergies or rhinitis.     Lactobacillus Rhamnosus, GG, (CULTURELLE) CAPS Take by mouth.     Probiotic Product (ALIGN) 4 MG CAPS as directed Orally     Wheat Dextrin (BENEFIBER) POWD as directed Orally     No current facility-administered medications on file prior to visit.    Allergies  Allergen Reactions   Ace Inhibitors Cough    Other reaction(s): Cough (ALLERGY/intolerance)    Past Medical History:  Diagnosis Date   Abnormal echocardiogram Feb 2013   biatrial enlargement. EF 45 to 50%.    Carotid stenosis, left    moderate Left carotid stenosis   H/O: rheumatic fever     Hypercholesteremia    Hypertension    Normal nuclear stress test Oct 2009   No ischemia with EF of 63%   Paroxysmal A-fib (HCC)    on Flecainide ; converted spontaneously and cardioverted cancelled    Past Surgical History:  Procedure Laterality Date   CHOLECYSTECTOMY     INGUINAL HERNIA REPAIR     Status post left inguinal hernia repair    Social History   Tobacco Use  Smoking Status Former   Current packs/day: 0.00   Types: Cigarettes   Quit date: 01/31/1975   Years since quitting: 48.9  Smokeless Tobacco Never    Social History   Substance and Sexual Activity  Alcohol Use No    Family History  Problem Relation Age of Onset   Heart disease Mother    Stroke Mother     Review of Systems: The review of systems is per the HPI.  All other systems were reviewed and are negative.  Physical Exam: BP (!) 92/56   Pulse (!) 58   Ht 6' (1.829 m)   Wt 231 lb (104.8 kg)   SpO2 98%   BMI 31.33 kg/m  GENERAL:  Well appearing, obese WM in NAD HEENT:  PERRL, EOMI, sclera are clear. Oropharynx is clear. NECK:  No jugular venous distention, carotid upstroke brisk and symmetric, no bruits, no thyromegaly or adenopathy LUNGS:  Clear to auscultation  bilaterally CHEST:  Unremarkable HEART:  RRR,  PMI not displaced or sustained,S1 and S2 within normal limits, no S3, no S4: no clicks, no rubs, no murmurs ABD:  Soft, nontender. BS +, no masses or bruits. No hepatomegaly, no splenomegaly EXT:  2 + pulses throughout, no edema, no cyanosis no clubbing SKIN:  Warm and dry.  No rashes NEURO:  Alert and oriented x 3. Cranial nerves II through XII intact. PSYCH:  Cognitively intact   LABORATORY DATA:  Lab Results  Component Value Date   WBC 6.2 04/10/2014   HGB 13.5 04/10/2014   HCT 38.7 (L) 04/10/2014   PLT 205 04/10/2014   GLUCOSE 97 05/10/2018   CHOL 136 05/10/2018   TRIG 75 05/10/2018   HDL 51 05/10/2018   LDLCALC 70 05/10/2018   ALT 17 05/10/2018   AST 19 05/10/2018    NA 137 05/10/2018   K 3.9 05/10/2018   CL 100 05/10/2018   CREATININE 0.82 05/10/2018   BUN 20 05/10/2018   CO2 22 05/10/2018   TSH 3.12 07/11/2011   INR 1.3 (H) 08/29/2011   Labs dated 03/07/19: cholesterol 127, triglycerides 76, HDL 52, LDL 69. Glucose 113. Otherwise CMET normal Dated 05/16/19: normal CBC.  Dated 03/05/20: A1c 5.2%. glucose 107, otherwise CMET normal. Cholesterol 152, triglycerides 112, HDL 53, LDL 76. CBC normal. Dated 03/12/21: Hgb 13.3. cholesterol 151, triglycerides 91, HDL 56, LDL 75. CMET and CBC otherwise normal. Dated 03/18/22: hgb 13.6. A1c 5.4%. cholesterol 131, triglycerides 90, HDL 47. LDL 68. Otherwise CBC, CMET, TSH normal Dated 03/31/23: cholesterol 137, triglycerides 104, HDL 49, LDL 69. A1c 5.5%. CBC and CMET normal.   EKG Interpretation Date/Time:  Friday December 25 2023 09:23:50 EDT Ventricular Rate:  58 PR Interval:  188 QRS Duration:  104 QT Interval:  440 QTC Calculation: 431 R Axis:   17  Text Interpretation: Sinus bradycardia with sinus arrhythmia When compared with ECG of 05-Dec-2022 09:18, No significant change was found Confirmed by [place], Nicloe Frontera [phone]) on 12/25/2023 9:27:23 AM    Myoview  12/02/19: Study Highlights    The left ventricular ejection fraction is mildly decreased (45-54%). Nuclear stress EF: 53%. There was no ST segment deviation noted during stress. The study is normal. This is a low risk study.   Normal resting and stress perfusion. No ischemia or infarction EF 53%    Carotid dopplers 05/06/22: Summary:  Right Carotid: Velocities in the right ICA are consistent with a 40-59%                 stenosis.   Left Carotid: Velocities in the left ICA are consistent with a 60-79%  stenosis.   Vertebrals: Bilateral vertebral arteries demonstrate antegrade flow.  Subclavians: Normal flow hemodynamics were seen in bilateral subclavian               arteries.   Assessment / Plan: 1. Paroxysmal Atrial fibrillation, well  controlled on flecainide . Infrequent episodes. [place] VASC score is 3. Continue Eliquis  and Tambecor.  2. Carotid arterial disease. Carotid dopplers show moderate bilateral disease. Repeat Nov 2024 showed no change.   3. Hypertension, well controlled.  4. Hyperlipidemia. Good control on simvastatin .     Continue current therapy. Follow up in one year

## 2023-12-22 ENCOUNTER — Other Ambulatory Visit: Payer: Self-pay | Admitting: Cardiology

## 2023-12-22 DIAGNOSIS — I480 Paroxysmal atrial fibrillation: Secondary | ICD-10-CM

## 2023-12-25 ENCOUNTER — Encounter: Payer: Self-pay | Admitting: Cardiology

## 2023-12-25 ENCOUNTER — Ambulatory Visit: Attending: Cardiology | Admitting: Cardiology

## 2023-12-25 VITALS — BP 92/56 | HR 58 | Ht 72.0 in | Wt 231.0 lb

## 2023-12-25 DIAGNOSIS — E7800 Pure hypercholesterolemia, unspecified: Secondary | ICD-10-CM | POA: Diagnosis not present

## 2023-12-25 DIAGNOSIS — I480 Paroxysmal atrial fibrillation: Secondary | ICD-10-CM | POA: Diagnosis not present

## 2023-12-25 DIAGNOSIS — I10 Essential (primary) hypertension: Secondary | ICD-10-CM

## 2023-12-25 MED ORDER — SIMVASTATIN 40 MG PO TABS: 40.0000 mg | ORAL_TABLET | Freq: Every day | ORAL | 3 refills | Status: AC

## 2023-12-25 MED ORDER — FLECAINIDE ACETATE 100 MG PO TABS: 100.0000 mg | ORAL_TABLET | Freq: Two times a day (BID) | ORAL | 3 refills | Status: AC

## 2023-12-25 MED ORDER — LOSARTAN POTASSIUM-HCTZ 100-12.5 MG PO TABS: 1.0000 | ORAL_TABLET | Freq: Every day | ORAL | 3 refills | Status: AC

## 2023-12-25 MED ORDER — APIXABAN 5 MG PO TABS: 5.0000 mg | ORAL_TABLET | Freq: Two times a day (BID) | ORAL | 3 refills | Status: AC

## 2023-12-25 NOTE — Patient Instructions (Signed)
